# Patient Record
Sex: Female | Born: 1960 | Race: White | Hispanic: No | Marital: Married | Smoking: Never smoker
Health system: Southern US, Community
[De-identification: ages and names within clinical notes are randomized; demographics above are authoritative.]

## PROBLEM LIST (undated history)

## (undated) DIAGNOSIS — C9191 Lymphoid leukemia, unspecified, in remission: Secondary | ICD-10-CM

## (undated) DIAGNOSIS — G451 Carotid artery syndrome (hemispheric): Secondary | ICD-10-CM

## (undated) DIAGNOSIS — K219 Gastro-esophageal reflux disease without esophagitis: Secondary | ICD-10-CM

## (undated) HISTORY — PX: VENTRICULOPERITONEAL SHUNT: SHX204

---

## 2018-02-11 ENCOUNTER — Encounter: Payer: Self-pay | Admitting: Neurology

## 2018-04-01 ENCOUNTER — Ambulatory Visit (INDEPENDENT_AMBULATORY_CARE_PROVIDER_SITE_OTHER): Admitting: Neurology

## 2018-04-01 ENCOUNTER — Encounter: Payer: Self-pay | Admitting: Neurology

## 2018-04-01 ENCOUNTER — Telehealth: Payer: Self-pay

## 2018-04-01 VITALS — BP 106/68 | HR 98 | Ht 66.0 in | Wt 208.0 lb

## 2018-04-01 DIAGNOSIS — G44229 Chronic tension-type headache, not intractable: Secondary | ICD-10-CM | POA: Diagnosis not present

## 2018-04-01 DIAGNOSIS — R29898 Other symptoms and signs involving the musculoskeletal system: Secondary | ICD-10-CM | POA: Diagnosis not present

## 2018-04-01 NOTE — Telephone Encounter (Signed)
[facility] [phone] to get fax # to request most recent EKG [phone]

## 2018-04-01 NOTE — Progress Notes (Signed)
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  History supplemented by referring provider's note.  She started having headaches a year ago.  They are moderate to severe nonthrobbing headache located frontal or temporal either side or right occipital region.  There is no preceding Martha.  They last 6 hours to all day and occur daily.  They are not associated with nausea, vomiting, photophobia, phonophobia, autonomic symptoms or unilateral numbness or weakness.  They are not aggravated by anything specific.  They are relieved when applying pressure to her head or eye.  She takes Tylenol 4 to 5 days a week.  She was told by her oncologist not to take NSAIDs.  She reports prior history of migraines in her 19s which aborted after having her son at age 57.    Caffeine:  2 cups coffee daily Diet:  Hydrates Sleep hygiene:  Good Depression:  No; Anxiety: No  Other History:  She was diagnosed and treated for ALL in 2010. She underwent transplant and has since been in remission.  Her left eye is affected.  She was admitted to [facility] in [place], [place] on 05/19/15 for possible TIA.  She had sudden onset word-finding difficulty with difficulty naming objects and left upper extremity ataxia.  There was no associated unilateral numbness or weakness or headache.  Symptoms lasted 5 minutes.  She was transferred from another ED.  CT of head showed prior right frontal shunt (for prior ALL treatment) but was negative for acute findings.  tPA was not administered due to resolution of symptoms.  MRI of brain demonstrated micro-hemorrhages but was  negative for acute infarct.  MRA of head and neck revealed no significant large vessel stenosis or occlusion.  She was not on ASA at the time.  She was discharged on ASA 325mg , atorvastatin 40mg  daily, lisinopril and Toprol XL.  She had a similar event in October 2015 in [place] where she experienced speech impairment and right hand tingling lasting 10 minutes.  She was admitted to [place].  MRI of brain reportedly negative for acute stroke.  TTE showed decreased EF of 35-40% and she was started on metoprolol and lisinopril.  She was discharged on ASA and statin.  Repeat TTE from December 2015 showed EF 53% and antihypertensives were discontinued.  She is currently taking ASA 81mg  daily and atorvastatin 40mg  daily.  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 pantoprazole (PROTONIX) 20 MG tablet Take by mouth.    . Albuterol Sulfate (PROAIR RESPICLICK) 185 (90 Base) MCG/ACT AEPB Inhale into the lungs.    . ASPIRIN LOW DOSE 81 MG EC tablet Take 81 mg by mouth daily.  3  . atorvastatin (LIPITOR) 40 MG tablet Take by mouth.    . candesartan (ATACAND) 16 MG tablet TK 1/2 T PO QD  3  . cetirizine (ZYRTEC) 10 MG tablet Take 10 mg by mouth as needed.  3  . estradiol (ESTRACE) 0.1 MG/GM vaginal cream   2  . fluticasone (FLONASE) 50 MCG/ACT nasal spray Place into the nose.    Marland Kitchen MAGNESIUM-OXIDE 400 (241.3 Mg) MG tablet TK 1 T PO QD  2  . metoprolol succinate (TOPROL-XL) 25 MG 24 hr tablet TK 1 T PO QD  3  . montelukast (SINGULAIR) 10 MG tablet     . MYRBETRIQ 50 MG TB24 tablet     . SYSTANE 0.4-0.3 % SOLN      No current facility-administered medications on file prior to visit.     ALLERGIES: Not on File  FAMILY HISTORY: Family  History  Problem Relation Age of Onset  . Breast cancer Mother   . Lymphoma Father     SOCIAL HISTORY: Social History   Socioeconomic History  . Marital status: Married    Spouse name: Merry Proud  . Number of children: Not on file  . Years of education: Not on file  . Highest education level: Some college, no degree  Occupational History  . Occupation: homemaker  Social Needs  . Financial resource strain: Not on file  . Food insecurity:    Worry: Not on file    Inability: Not on file  . Transportation needs:    Medical: Not on file    Non-medical: Not on file  Tobacco Use  . Smoking status: Never Smoker  . Smokeless tobacco: Never Used  Substance and Sexual Activity  . Alcohol use: Not Currently  . Drug use: Never  . Sexual activity: Not on file  Lifestyle  . Physical activity:    Days per week: Not on file    Minutes per session: Not on file  . Stress: Not on file  Relationships  . Social connections:    Talks on phone: Not on file    Gets together: Not on file    Attends religious service: Not on file    Active member of club or organization: Not on file    Attends meetings of clubs or organizations: Not on file    Relationship status: Not on file  . Intimate partner violence:    Fear of current or ex partner: Not on file    Emotionally abused: Not on file    Physically abused: Not on file    Forced sexual activity: Not on file  Other Topics Concern  . Not on file  Social History Narrative   Patient is right-handed. She lives with her husband in a 2 story house. She drinks 2 cups of coffee a day. She participates in water aerobics 5 x week.    REVIEW OF SYSTEMS: Constitutional: No fevers, chills, or sweats, no generalized fatigue, change in appetite Eyes: No visual changes, double vision, eye pain Ear, nose and throat: No hearing loss, ear pain, nasal congestion, sore throat Cardiovascular: No chest pain, palpitations Respiratory:  No shortness of breath at  rest or with exertion, wheezes GastrointestinaI: No nausea, vomiting, diarrhea, abdominal pain, fecal incontinence Genitourinary:  No dysuria, urinary retention or frequency Musculoskeletal:  No neck pain, back pain Integumentary: No rash, pruritus, skin lesions Neurological: as above Psychiatric: No depression, insomnia, anxiety Endocrine: No palpitations, fatigue, diaphoresis, mood swings, change in appetite, change in weight, increased thirst Hematologic/Lymphatic:  No purpura, petechiae. Allergic/Immunologic: no itchy/runny eyes, nasal congestion, recent allergic reactions, rashes  PHYSICAL EXAM: Vitals:   04/01/18 0921  BP: 106/68  Pulse: 98  SpO2: 95%   General: No acute distress.  Patient appears well-groomed.  Head:  Normocephalic/atraumatic Eyes:  fundi examined but not visualized Neck: supple, no paraspinal tenderness, full range of motion Back: No paraspinal tenderness Heart: regular rate and rhythm Lungs: Clear to auscultation bilaterally. Vascular: No carotid bruits. Neurological Exam: Mental status: alert and oriented to person, place, and time, recent and remote memory intact, fund of knowledge intact, attention and concentration intact, speech fluent and not dysarthric, language intact. Cranial nerves: CN I: not tested CN II: pupils equal, round and reactive to light, visual fields intact CN III, IV, VI:  full range of motion, no nystagmus, no ptosis CN V: facial sensation intact CN VII: upper and lower face symmetric CN VIII: hearing intact CN IX, X: gag intact, uvula midline CN XI: sternocleidomastoid and trapezius muscles intact CN XII: tongue midline Bulk & Tone: normal, no fasciculations. Motor:  4+/5 left deltoid.  Otherwise, 5/5 throughout Sensation:  Pinprick and vibration sensation intact. Deep Tendon Reflexes:  2+ throughout, toes downgoing.  Finger to nose testing:  Without dysmetria.  Heel to shin:  Without dysmetria.  Gait:  Normal station and  stride.  Able to turn and tandem walk. Romberg negative.  IMPRESSION: Chronic tension-type headache Left deltoid weakness.  She said she had a dTAP shot 2 days ago, however she denies associated pain.  PLAN: 1.  Plan is to start nortriptyline 10mg  at bedtime.  Will first check QT interval on recent EKG from PCP (patient also on Myrbetriq) 2.  Limit Tylenol to no more than 2 days out of week to prevent rebound headache.  May take with caffeine.  Otherwise, eliminate caffeine 3.  Headache diary 4.  I have no explanation for left deltoid weakness.  We will check MRI of brain.  Thank you for allowing me to take part in the care of this patient.  Metta Clines, DO

## 2018-04-01 NOTE — Patient Instructions (Addendum)
1.  I will review EKG first.  If okay, then start nortriptyline 10mg  at bedtime.  If headaches not improved in 4 weeks, contact me and we can increase dose 2.  Limit use of Tylenol to no more than 2 days out of week to prevent rebound headache 3.  Keep headache diary 4.  Stop coffee 5.  Due to right shoulder weakness, will check MRI of brain 6.  Follow up in 3 to 4 months.  We have sent a referral to [facility] for your MRI and they will call you directly to schedule your appt. They are located at [place]. If you need to contact them directly please call [phone].

## 2018-04-03 ENCOUNTER — Telehealth: Payer: Self-pay | Admitting: Neurology

## 2018-04-03 NOTE — Telephone Encounter (Signed)
Called and spoke with Pt, advised order has been faxed. Confirmed fax #

## 2018-04-03 NOTE — Telephone Encounter (Signed)
Patient called regarding having her Order sent to [facility] versus [facility]. She said that [facility] will not take her Insurance.  Thanks

## 2018-04-06 ENCOUNTER — Telehealth: Payer: Self-pay | Admitting: Neurology

## 2018-04-06 NOTE — Telephone Encounter (Signed)
Melissa called from [facility]. She is needing a new order faxed over to them with more information and History. She said at this time she has not scheduled the patient. Thanks

## 2018-04-07 NOTE — Telephone Encounter (Signed)
Faxed new order and consult note

## 2018-04-08 ENCOUNTER — Telehealth: Payer: Self-pay | Admitting: Neurology

## 2018-04-08 NOTE — Telephone Encounter (Signed)
Melissa called back this morning checking on the status of the order. She said it looks like it has not been scheduled. Can you re fax the order to [phone]. Thanks

## 2018-04-13 NOTE — Telephone Encounter (Signed)
Called and LMOVM for Melissa to return my call. Order and notes were faxed

## 2018-04-17 ENCOUNTER — Ambulatory Visit: Payer: Self-pay | Admitting: Neurology

## 2018-04-17 ENCOUNTER — Encounter

## 2018-04-22 ENCOUNTER — Telehealth: Payer: Self-pay | Admitting: Neurology

## 2018-04-22 NOTE — Telephone Encounter (Signed)
Called and spoke with Pt, advised her of MRI results. 

## 2018-04-22 NOTE — Telephone Encounter (Signed)
I reviewed Martha Green's MRI of brain without contrast report from 04/17/18.  It reveals chronic changes from her VP shunt but no stroke or tumor.

## 2018-07-01 NOTE — Progress Notes (Signed)
NEUROLOGY FOLLOW UP OFFICE NOTE  Len Kluver [id number]  HISTORY OF PRESENT ILLNESS: Martha Green is a 57 year old right-handed female with cardiomyopathy, hyperlipidemia, osteopenia, GERD and history of a LL status post transplant in remission and TIAs who follows up for chronic tension type headache.  UPDATE: Intensity:  moderate Duration:  2 hours Frequency:  One day a week Frequency of abortive medication: once a week Current NSAIDS:  ASA 81mg  Current analgesics:  Tylenol as needed Current triptans:  none Current ergotamine: None Current anti-emetic: None Current muscle relaxants: None Current anti-anxiolytic: None Current sleep aide: None Current Antihypertensive medications:  Toprol XL Current Antidepressant medications: None Current Anticonvulsant medications: None Current anti-CGRP: None Current Vitamins/Herbal/Supplements:  magnesium Current Antihistamines/Decongestants: None Other therapy: None  Last visit in August, she exhibited left deltoid weakness on exam.  She reported recent dTAP in that deltoid but denied any associated pain.  MRI of brain with and without contrast from 04/18/18 demonstrated stable VP shunt with chronic nonspecific white matter changes but no evidence of hydrocephalus or acute intracranial abnormality  Caffeine: Down to 1 cup of coffee daily Hydration: Yes Sleep hygiene: Good Depression: No; anxiety: No  HISTORY:  She started having headaches in 2018.  They are moderate to severe nonthrobbing headache located frontal or temporal either side or right occipital region.  There is no preceding aura.  They last 6 hours to all day and occur daily.  They are not associated with nausea, vomiting, photophobia, phonophobia, autonomic symptoms or unilateral numbness or weakness.  They are not aggravated by anything specific.  They are relieved when applying pressure to her head or eye.  She takes Tylenol 4 to 5 days a week.  She was told by her oncologist  not to take NSAIDs.  She reports prior history of migraines in her 3s which aborted after having her son at age 35.    Other History:  She was diagnosed and treated for ALL in 2010. She underwent transplant and has since been in remission.  Her left eye is affected.  She was admitted to [facility] in [place], [place] on 05/19/15 for possible TIA.  She had sudden onset word-finding difficulty with difficulty naming objects and left upper extremity ataxia.  There was no associated unilateral numbness or weakness or headache.  Symptoms lasted 5 minutes.  She was transferred from another ED.  CT of head showed prior right frontal shunt (for prior ALL treatment) but was negative for acute findings.  tPA was not administered due to resolution of symptoms.  MRI of brain demonstrated micro-hemorrhages but was negative for acute infarct.  MRA of head and neck revealed no significant large vessel stenosis or occlusion.  She was not on ASA at the time.  She was discharged on ASA 325mg , atorvastatin 40mg  daily, lisinopril and Toprol XL.  She had a similar event in October 2015 in [place] where she experienced speech impairment and right hand tingling lasting 10 minutes.  She was admitted to [place].  MRI of brain reportedly negative for acute stroke.  TTE showed decreased EF of 35-40% and she was started on metoprolol and lisinopril.  She was discharged on ASA and statin.  Repeat TTE from December 2015 showed EF 53% and antihypertensives were discontinued.  She   PAST MEDICAL HISTORY: ALL in remission Transient ischemic attack x2 Cardiomyopathy Hyperlipidemia Osteopenia GERD  MEDICATIONS: Current Outpatient Medications on File Prior to Visit  Medication Sig Dispense Refill  . Albuterol Sulfate (PROAIR RESPICLICK)  108 (90 Base) MCG/ACT AEPB Inhale into the lungs.    . ASPIRIN LOW DOSE 81 MG EC tablet Take 81 mg by mouth daily.  3  . atorvastatin (LIPITOR) 40 MG tablet Take by mouth.    .  candesartan (ATACAND) 16 MG tablet TK 1/2 T PO QD  3  . cetirizine (ZYRTEC) 10 MG tablet Take 10 mg by mouth as needed.  3  . estradiol (ESTRACE) 0.1 MG/GM vaginal cream   2  . fluticasone (FLONASE) 50 MCG/ACT nasal spray Place into the nose.    Marland Kitchen MAGNESIUM-OXIDE 400 (241.3 Mg) MG tablet TK 1 T PO QD  2  . metoprolol succinate (TOPROL-XL) 25 MG 24 hr tablet TK 1 T PO QD  3  . montelukast (SINGULAIR) 10 MG tablet     . MYRBETRIQ 50 MG TB24 tablet     . pantoprazole (PROTONIX) 20 MG tablet Take by mouth.    . SYSTANE 0.4-0.3 % SOLN      No current facility-administered medications on file prior to visit.     ALLERGIES: Not on File  FAMILY HISTORY: Family History  Problem Relation Age of Onset  . Breast cancer Mother   . Lymphoma Father    SOCIAL HISTORY: Social History   Socioeconomic History  . Marital status: Married    Spouse name: Merry Proud  . Number of children: Not on file  . Years of education: Not on file  . Highest education level: Some college, no degree  Occupational History  . Occupation: homemaker  Social Needs  . Financial resource strain: Not on file  . Food insecurity:    Worry: Not on file    Inability: Not on file  . Transportation needs:    Medical: Not on file    Non-medical: Not on file  Tobacco Use  . Smoking status: Never Smoker  . Smokeless tobacco: Never Used  Substance and Sexual Activity  . Alcohol use: Not Currently  . Drug use: Never  . Sexual activity: Not on file  Lifestyle  . Physical activity:    Days per week: Not on file    Minutes per session: Not on file  . Stress: Not on file  Relationships  . Social connections:    Talks on phone: Not on file    Gets together: Not on file    Attends religious service: Not on file    Active member of club or organization: Not on file    Attends meetings of clubs or organizations: Not on file    Relationship status: Not on file  . Intimate partner violence:    Fear of current or ex partner:  Not on file    Emotionally abused: Not on file    Physically abused: Not on file    Forced sexual activity: Not on file  Other Topics Concern  . Not on file  Social History Narrative   Patient is right-handed. She lives with her husband in a 2 story house. She drinks 2 cups of coffee a day. She participates in water aerobics 5 x week.    REVIEW OF SYSTEMS: Constitutional: No fevers, chills, or sweats, no generalized fatigue, change in appetite Eyes: No visual changes, double vision, eye pain Ear, nose and throat: No hearing loss, ear pain, nasal congestion, sore throat Cardiovascular: No chest pain, palpitations Respiratory:  No shortness of breath at rest or with exertion, wheezes GastrointestinaI: No nausea, vomiting, diarrhea, abdominal pain, fecal incontinence Genitourinary:  No dysuria, urinary retention  or frequency Musculoskeletal:  No neck pain, back pain Integumentary: No rash, pruritus, skin lesions Neurological: as above Psychiatric: No depression, insomnia, anxiety Endocrine: No palpitations, fatigue, diaphoresis, mood swings, change in appetite, change in weight, increased thirst Hematologic/Lymphatic:  No purpura, petechiae. Allergic/Immunologic: no itchy/runny eyes, nasal congestion, recent allergic reactions, rashes  PHYSICAL EXAM: Blood pressure 98/68, pulse 86, height 5\' 6"  (1.676 m), weight 191 lb (86.6 kg), SpO2 97 %. General: No acute distress.  Patient appears well-groomed.   Head:  Normocephalic/atraumatic Eyes:  Fundi examined but not visualized Neck: supple, no paraspinal tenderness, full range of motion Heart:  Regular rate and rhythm Lungs:  Clear to auscultation bilaterally Back: No paraspinal tenderness Neurological Exam: alert and oriented to person, place, and time. Attention span and concentration intact, recent and remote memory intact, fund of knowledge intact.  Speech fluent and not dysarthric, language intact.  CN II-XII intact. Bulk and tone  normal, muscle strength 5/5 throughout.  Sensation to light touch intact.  Deep tendon reflexes 2+ throughout, toes downgoing.  Finger to nose and heel to shin testing intact.  Gait normal, Romberg negative.  IMPRESSION: 1.  Episodic tension-type headache, not intractable 2.  Right deltoid weakness.  Now resolved.  Likely, it was just residual from recent dTAP  PLAN: 1.  Tylenol as needed, limited to no more than 2 days out of week to prevent rebound headache 2.  Keep headache diary 3.  Follow up in 6 months.  Metta Clines, DO

## 2018-07-02 ENCOUNTER — Encounter: Payer: Self-pay | Admitting: Neurology

## 2018-07-02 ENCOUNTER — Ambulatory Visit (INDEPENDENT_AMBULATORY_CARE_PROVIDER_SITE_OTHER): Payer: TRICARE For Life (TFL) | Admitting: Neurology

## 2018-07-02 VITALS — BP 98/68 | HR 86 | Ht 66.0 in | Wt 191.0 lb

## 2018-07-02 DIAGNOSIS — G44219 Episodic tension-type headache, not intractable: Secondary | ICD-10-CM | POA: Diagnosis not present

## 2018-07-02 NOTE — Patient Instructions (Signed)
1.  Use Tylenol as needed but limit to no more than 2 days out of week to prevent rebound headache 2.  Follow up in 6 months

## 2018-09-25 ENCOUNTER — Emergency Department (HOSPITAL_COMMUNITY)

## 2018-09-25 ENCOUNTER — Inpatient Hospital Stay (HOSPITAL_COMMUNITY)
Admission: EM | Admit: 2018-09-25 | Discharge: 2018-10-25 | DRG: 082 | Disposition: E | Attending: Neurosurgery | Admitting: Neurosurgery

## 2018-09-25 ENCOUNTER — Encounter (HOSPITAL_COMMUNITY): Payer: Self-pay

## 2018-09-25 ENCOUNTER — Emergency Department (HOSPITAL_COMMUNITY): Admission: EM | Admit: 2018-09-25 | Discharge: 2018-09-25 | Discharge status: Against Medical Advice | Payer: Self-pay

## 2018-09-25 ENCOUNTER — Other Ambulatory Visit: Payer: Self-pay

## 2018-09-25 DIAGNOSIS — S0001XA Abrasion of scalp, initial encounter: Secondary | ICD-10-CM | POA: Diagnosis not present

## 2018-09-25 DIAGNOSIS — Z7982 Long term (current) use of aspirin: Secondary | ICD-10-CM | POA: Diagnosis not present

## 2018-09-25 DIAGNOSIS — H5702 Anisocoria: Secondary | ICD-10-CM | POA: Diagnosis present

## 2018-09-25 DIAGNOSIS — G935 Compression of brain: Secondary | ICD-10-CM | POA: Diagnosis not present

## 2018-09-25 DIAGNOSIS — Y92009 Unspecified place in unspecified non-institutional (private) residence as the place of occurrence of the external cause: Secondary | ICD-10-CM

## 2018-09-25 DIAGNOSIS — S066X9A Traumatic subarachnoid hemorrhage with loss of consciousness of unspecified duration, initial encounter: Secondary | ICD-10-CM | POA: Diagnosis present

## 2018-09-25 DIAGNOSIS — J9690 Respiratory failure, unspecified, unspecified whether with hypoxia or hypercapnia: Secondary | ICD-10-CM

## 2018-09-25 DIAGNOSIS — E669 Obesity, unspecified: Secondary | ICD-10-CM | POA: Diagnosis not present

## 2018-09-25 DIAGNOSIS — Z982 Presence of cerebrospinal fluid drainage device: Secondary | ICD-10-CM | POA: Diagnosis not present

## 2018-09-25 DIAGNOSIS — R402312 Coma scale, best motor response, none, at arrival to emergency department: Secondary | ICD-10-CM | POA: Diagnosis present

## 2018-09-25 DIAGNOSIS — R092 Respiratory arrest: Secondary | ICD-10-CM | POA: Diagnosis present

## 2018-09-25 DIAGNOSIS — Z807 Family history of other malignant neoplasms of lymphoid, hematopoietic and related tissues: Secondary | ICD-10-CM

## 2018-09-25 DIAGNOSIS — G936 Cerebral edema: Secondary | ICD-10-CM | POA: Diagnosis not present

## 2018-09-25 DIAGNOSIS — Z4659 Encounter for fitting and adjustment of other gastrointestinal appliance and device: Secondary | ICD-10-CM

## 2018-09-25 DIAGNOSIS — S065X9A Traumatic subdural hemorrhage with loss of consciousness of unspecified duration, initial encounter: Secondary | ICD-10-CM

## 2018-09-25 DIAGNOSIS — C9191 Lymphoid leukemia, unspecified, in remission: Secondary | ICD-10-CM | POA: Diagnosis present

## 2018-09-25 DIAGNOSIS — Z803 Family history of malignant neoplasm of breast: Secondary | ICD-10-CM

## 2018-09-25 DIAGNOSIS — R402212 Coma scale, best verbal response, none, at arrival to emergency department: Secondary | ICD-10-CM | POA: Diagnosis not present

## 2018-09-25 DIAGNOSIS — I609 Nontraumatic subarachnoid hemorrhage, unspecified: Secondary | ICD-10-CM

## 2018-09-25 DIAGNOSIS — W19XXXA Unspecified fall, initial encounter: Secondary | ICD-10-CM

## 2018-09-25 DIAGNOSIS — R402112 Coma scale, eyes open, never, at arrival to emergency department: Secondary | ICD-10-CM | POA: Diagnosis not present

## 2018-09-25 DIAGNOSIS — Z6828 Body mass index (BMI) 28.0-28.9, adult: Secondary | ICD-10-CM | POA: Diagnosis not present

## 2018-09-25 DIAGNOSIS — K219 Gastro-esophageal reflux disease without esophagitis: Secondary | ICD-10-CM | POA: Diagnosis not present

## 2018-09-25 DIAGNOSIS — S065XAA Traumatic subdural hemorrhage with loss of consciousness status unknown, initial encounter: Secondary | ICD-10-CM

## 2018-09-25 DIAGNOSIS — Z79899 Other long term (current) drug therapy: Secondary | ICD-10-CM

## 2018-09-25 DIAGNOSIS — W109XXA Fall (on) (from) unspecified stairs and steps, initial encounter: Secondary | ICD-10-CM | POA: Diagnosis present

## 2018-09-25 HISTORY — DX: Lymphoid leukemia, unspecified, in remission: C91.91

## 2018-09-25 HISTORY — DX: Carotid artery syndrome (hemispheric): G45.1

## 2018-09-25 HISTORY — DX: Gastro-esophageal reflux disease without esophagitis: K21.9

## 2018-09-25 LAB — CBC
HEMATOCRIT: 31.8 % — AB (ref 36.0–46.0)
Hemoglobin: 10.7 g/dL — ABNORMAL LOW (ref 12.0–15.0)
MCH: 30.5 pg (ref 26.0–34.0)
MCHC: 33.6 g/dL (ref 30.0–36.0)
MCV: 90.6 fL (ref 80.0–100.0)
Platelets: 301 10*3/uL (ref 150–400)
RBC: 3.51 MIL/uL — ABNORMAL LOW (ref 3.87–5.11)
RDW: 12.7 % (ref 11.5–15.5)
WBC: 14.7 10*3/uL — ABNORMAL HIGH (ref 4.0–10.5)
nRBC: 0 % (ref 0.0–0.2)

## 2018-09-25 LAB — CBG MONITORING, ED: Glucose-Capillary: 124 mg/dL — ABNORMAL HIGH (ref 70–99)

## 2018-09-25 LAB — PROTIME-INR
INR: 1.03
Prothrombin Time: 13.4 seconds (ref 11.4–15.2)

## 2018-09-25 LAB — SAMPLE TO BLOOD BANK

## 2018-09-25 MED ORDER — ETOMIDATE 2 MG/ML IV SOLN
INTRAVENOUS | Status: AC | PRN
  Administered 2018-09-25: 20 mg via INTRAVENOUS

## 2018-09-25 MED ORDER — ROCURONIUM BROMIDE 50 MG/5ML IV SOLN
INTRAVENOUS | Status: AC | PRN
  Administered 2018-09-25: 80 mg via INTRAVENOUS

## 2018-09-25 MED ORDER — PROPOFOL 1000 MG/100ML IV EMUL
INTRAVENOUS | Status: AC
  Administered 2018-09-25: 5 ug/kg/min via INTRAVENOUS
  Filled 2018-09-25: qty 100

## 2018-09-25 MED ORDER — PROPOFOL 1000 MG/100ML IV EMUL
5.0000 ug/kg/min | INTRAVENOUS | Status: DC
  Administered 2018-09-25: 5 ug/kg/min via INTRAVENOUS

## 2018-09-25 NOTE — ED Notes (Signed)
Successful intubation w/ 7.5 ETT by MD Palumbo. Equal blt breath sounds w/ good color changes. Sats 99%

## 2018-09-25 NOTE — ED Triage Notes (Signed)
Pt BIB [facility] s/p fall. Pt fell backwards down 3 steps striking head and was found down. Initially GCS of 11 for EMS answering yes/no, decompensated to GCS of 3 on arrival. Pt is unresponsive, maintaining airway.

## 2018-09-25 NOTE — ED Notes (Signed)
Pt to CT w/ RN, trauma MD and ED MD

## 2018-09-25 NOTE — Progress Notes (Signed)
   09/22/2018 2350  Clinical Encounter Type  Visited With Family;Patient not available;Health care provider  Visit Type Initial;ED;Trauma  Referral From Other (Comment) (level 1 pg)  Spiritual Encounters  Spiritual Needs Emotional  Stress Factors  Family Stress Factors Other (Comment) (friends present, family out of state)  Regulatory affairs officer (For Healthcare)  Does Patient Have a Medical Advance Directive? No  Would patient like information on creating a medical advance directive? No - Patient declined  [place]  Does Patient Have a Mental Health Advance Directive? No  Would patient like information on creating a mental health advance directive? No - Patient declined   Responded to level 1 trauma, spoke w/ neighbors who had just dropped pt off at her house, saw her fall, and called 911.  Got husband and son's cell #s from neighbors, gave to RN and to trauma dr.  Bo Mcclintock friends/neighbors know to convey to son to start driving this direction from his home in [place].    Chaplain remains available.  Myra Gianotti resident, [phone]

## 2018-09-26 ENCOUNTER — Encounter (HOSPITAL_COMMUNITY): Payer: Self-pay | Admitting: Emergency Medicine

## 2018-09-26 ENCOUNTER — Inpatient Hospital Stay (HOSPITAL_COMMUNITY)

## 2018-09-26 DIAGNOSIS — Z7982 Long term (current) use of aspirin: Secondary | ICD-10-CM | POA: Diagnosis not present

## 2018-09-26 DIAGNOSIS — K219 Gastro-esophageal reflux disease without esophagitis: Secondary | ICD-10-CM | POA: Diagnosis present

## 2018-09-26 DIAGNOSIS — G935 Compression of brain: Secondary | ICD-10-CM | POA: Diagnosis present

## 2018-09-26 DIAGNOSIS — E669 Obesity, unspecified: Secondary | ICD-10-CM | POA: Diagnosis present

## 2018-09-26 DIAGNOSIS — R402312 Coma scale, best motor response, none, at arrival to emergency department: Secondary | ICD-10-CM | POA: Diagnosis present

## 2018-09-26 DIAGNOSIS — G936 Cerebral edema: Secondary | ICD-10-CM | POA: Diagnosis present

## 2018-09-26 DIAGNOSIS — S0001XA Abrasion of scalp, initial encounter: Secondary | ICD-10-CM | POA: Diagnosis present

## 2018-09-26 DIAGNOSIS — H5702 Anisocoria: Secondary | ICD-10-CM | POA: Diagnosis present

## 2018-09-26 DIAGNOSIS — Z982 Presence of cerebrospinal fluid drainage device: Secondary | ICD-10-CM | POA: Diagnosis not present

## 2018-09-26 DIAGNOSIS — R402212 Coma scale, best verbal response, none, at arrival to emergency department: Secondary | ICD-10-CM | POA: Diagnosis present

## 2018-09-26 DIAGNOSIS — W109XXA Fall (on) (from) unspecified stairs and steps, initial encounter: Secondary | ICD-10-CM | POA: Diagnosis present

## 2018-09-26 DIAGNOSIS — Z807 Family history of other malignant neoplasms of lymphoid, hematopoietic and related tissues: Secondary | ICD-10-CM | POA: Diagnosis not present

## 2018-09-26 DIAGNOSIS — Z803 Family history of malignant neoplasm of breast: Secondary | ICD-10-CM | POA: Diagnosis not present

## 2018-09-26 DIAGNOSIS — S066X9A Traumatic subarachnoid hemorrhage with loss of consciousness of unspecified duration, initial encounter: Secondary | ICD-10-CM | POA: Diagnosis present

## 2018-09-26 DIAGNOSIS — Z79899 Other long term (current) drug therapy: Secondary | ICD-10-CM | POA: Diagnosis not present

## 2018-09-26 DIAGNOSIS — Z6828 Body mass index (BMI) 28.0-28.9, adult: Secondary | ICD-10-CM | POA: Diagnosis not present

## 2018-09-26 DIAGNOSIS — R402112 Coma scale, eyes open, never, at arrival to emergency department: Secondary | ICD-10-CM | POA: Diagnosis present

## 2018-09-26 DIAGNOSIS — I609 Nontraumatic subarachnoid hemorrhage, unspecified: Secondary | ICD-10-CM

## 2018-09-26 DIAGNOSIS — C9191 Lymphoid leukemia, unspecified, in remission: Secondary | ICD-10-CM | POA: Diagnosis present

## 2018-09-26 DIAGNOSIS — Y92009 Unspecified place in unspecified non-institutional (private) residence as the place of occurrence of the external cause: Secondary | ICD-10-CM | POA: Diagnosis not present

## 2018-09-26 DIAGNOSIS — R092 Respiratory arrest: Secondary | ICD-10-CM | POA: Diagnosis present

## 2018-09-26 LAB — PREPARE FRESH FROZEN PLASMA: Unit division: 0

## 2018-09-26 LAB — POCT I-STAT 7, (LYTES, BLD GAS, ICA,H+H)
Acid-base deficit: 4 mmol/L — ABNORMAL HIGH (ref 0.0–2.0)
Acid-base deficit: 4 mmol/L — ABNORMAL HIGH (ref 0.0–2.0)
Bicarbonate: 19.2 mmol/L — ABNORMAL LOW (ref 20.0–28.0)
Bicarbonate: 21.1 mmol/L (ref 20.0–28.0)
Calcium, Ion: 1.11 mmol/L — ABNORMAL LOW (ref 1.15–1.40)
Calcium, Ion: 1.19 mmol/L (ref 1.15–1.40)
HCT: 32 % — ABNORMAL LOW (ref 36.0–46.0)
HCT: 35 % — ABNORMAL LOW (ref 36.0–46.0)
Hemoglobin: 10.9 g/dL — ABNORMAL LOW (ref 12.0–15.0)
Hemoglobin: 11.9 g/dL — ABNORMAL LOW (ref 12.0–15.0)
O2 Saturation: 100 %
O2 Saturation: 99 %
PCO2 ART: 28.3 mmHg — AB (ref 32.0–48.0)
PH ART: 7.434 (ref 7.350–7.450)
Patient temperature: 96.5
Patient temperature: 97.6
Potassium: 2.6 mmol/L — CL (ref 3.5–5.1)
Potassium: 3.1 mmol/L — ABNORMAL LOW (ref 3.5–5.1)
Sodium: 121 mmol/L — ABNORMAL LOW (ref 135–145)
Sodium: 124 mmol/L — ABNORMAL LOW (ref 135–145)
TCO2: 20 mmol/L — ABNORMAL LOW (ref 22–32)
TCO2: 22 mmol/L (ref 22–32)
pCO2 arterial: 36.8 mmHg (ref 32.0–48.0)
pH, Arterial: 7.363 (ref 7.350–7.450)
pO2, Arterial: 169 mmHg — ABNORMAL HIGH (ref 83.0–108.0)
pO2, Arterial: 259 mmHg — ABNORMAL HIGH (ref 83.0–108.0)

## 2018-09-26 LAB — BASIC METABOLIC PANEL
Anion gap: 13 (ref 5–15)
BUN: 9 mg/dL (ref 6–20)
CO2: 21 mmol/L — ABNORMAL LOW (ref 22–32)
Calcium: 9.1 mg/dL (ref 8.9–10.3)
Chloride: 94 mmol/L — ABNORMAL LOW (ref 98–111)
Creatinine, Ser: 0.82 mg/dL (ref 0.44–1.00)
GFR calc non Af Amer: >60 mL/min (ref >60)
Glucose, Bld: 117 mg/dL — ABNORMAL HIGH (ref 70–99)
Potassium: 3.5 mmol/L (ref 3.5–5.1)
SODIUM: 128 mmol/L — AB (ref 135–145)

## 2018-09-26 LAB — CBC
HCT: 31.5 % — ABNORMAL LOW (ref 36.0–46.0)
Hemoglobin: 10.7 g/dL — ABNORMAL LOW (ref 12.0–15.0)
MCH: 29.7 pg (ref 26.0–34.0)
MCHC: 34 g/dL (ref 30.0–36.0)
MCV: 87.5 fL (ref 80.0–100.0)
NRBC: 0 % (ref 0.0–0.2)
Platelets: 291 10*3/uL (ref 150–400)
RBC: 3.6 MIL/uL — ABNORMAL LOW (ref 3.87–5.11)
RDW: 12.5 % (ref 11.5–15.5)
WBC: 13.3 10*3/uL — AB (ref 4.0–10.5)

## 2018-09-26 LAB — BPAM RBC
Blood Product Expiration Date: 202003042359
Blood Product Expiration Date: 202003042359
ISSUE DATE / TIME: 202001312307
ISSUE DATE / TIME: 202001312307
UNIT TYPE AND RH: 5100
Unit Type and Rh: 5100

## 2018-09-26 LAB — BPAM FFP
Blood Product Expiration Date: 202002022359
Blood Product Expiration Date: 202002022359
ISSUE DATE / TIME: 202001312307
ISSUE DATE / TIME: 202001312307
Unit Type and Rh: 6200
Unit Type and Rh: 6200

## 2018-09-26 LAB — COMPREHENSIVE METABOLIC PANEL
ALT: 17 U/L (ref 0–44)
AST: 25 U/L (ref 15–41)
Albumin: 3.8 g/dL (ref 3.5–5.0)
Alkaline Phosphatase: 87 U/L (ref 38–126)
Anion gap: 15 (ref 5–15)
BUN: 9 mg/dL (ref 6–20)
CALCIUM: 8.8 mg/dL — AB (ref 8.9–10.3)
CO2: 19 mmol/L — ABNORMAL LOW (ref 22–32)
Chloride: 88 mmol/L — ABNORMAL LOW (ref 98–111)
Creatinine, Ser: 0.84 mg/dL (ref 0.44–1.00)
GFR calc Af Amer: >60 mL/min (ref >60)
GFR calc non Af Amer: >60 mL/min (ref >60)
Glucose, Bld: 133 mg/dL — ABNORMAL HIGH (ref 70–99)
Potassium: 2.7 mmol/L — CL (ref 3.5–5.1)
Sodium: 122 mmol/L — ABNORMAL LOW (ref 135–145)
Total Bilirubin: 0.7 mg/dL (ref 0.3–1.2)
Total Protein: 6.9 g/dL (ref 6.5–8.1)

## 2018-09-26 LAB — ETHANOL: Alcohol, Ethyl (B): 106 mg/dL — ABNORMAL HIGH (ref <10)

## 2018-09-26 LAB — TRIGLYCERIDES: Triglycerides: 237 mg/dL — ABNORMAL HIGH (ref <150)

## 2018-09-26 LAB — LACTIC ACID, PLASMA: Lactic Acid, Venous: 2.8 mmol/L (ref 0.5–1.9)

## 2018-09-26 LAB — MRSA PCR SCREENING: MRSA by PCR: NEGATIVE

## 2018-09-26 LAB — CDS SEROLOGY

## 2018-09-26 LAB — HIV ANTIBODY (ROUTINE TESTING W REFLEX): HIV SCREEN 4TH GENERATION: NONREACTIVE

## 2018-09-26 MED ORDER — PANTOPRAZOLE SODIUM 40 MG PO TBEC: 40.0000 mg | DELAYED_RELEASE_TABLET | Freq: Every day | ORAL | Status: DC

## 2018-09-26 MED ORDER — HYDRALAZINE HCL 20 MG/ML IJ SOLN: 10.0000 mg | INTRAMUSCULAR | Status: DC | PRN

## 2018-09-26 MED ORDER — SODIUM CHLORIDE 0.9 % IV BOLUS
500.0000 mL | Freq: Once | INTRAVENOUS | Status: AC
  Administered 2018-09-26: 500 mL via INTRAVENOUS

## 2018-09-26 MED ORDER — FENTANYL CITRATE (PF) 100 MCG/2ML IJ SOLN
100.0000 ug | INTRAMUSCULAR | Status: DC | PRN
  Administered 2018-09-27: 100 ug via INTRAVENOUS
  Filled 2018-09-26: qty 2

## 2018-09-26 MED ORDER — ORAL CARE MOUTH RINSE
15.0000 mL | OROMUCOSAL | Status: DC
  Administered 2018-09-26 – 2018-09-28 (×22): 15 mL via OROMUCOSAL

## 2018-09-26 MED ORDER — ONDANSETRON 4 MG PO TBDP: 4.0000 mg | ORAL_TABLET | Freq: Four times a day (QID) | ORAL | Status: DC | PRN

## 2018-09-26 MED ORDER — METOPROLOL TARTRATE 5 MG/5ML IV SOLN
5.0000 mg | INTRAVENOUS | Status: DC | PRN
  Administered 2018-09-27 – 2018-09-28 (×2): 5 mg via INTRAVENOUS
  Filled 2018-09-26 (×3): qty 5

## 2018-09-26 MED ORDER — ACETAMINOPHEN 160 MG/5ML PO SOLN: 650.0000 mg | Freq: Four times a day (QID) | ORAL | Status: DC | PRN

## 2018-09-26 MED ORDER — ONDANSETRON HCL 4 MG/2ML IJ SOLN: 4.0000 mg | Freq: Four times a day (QID) | INTRAMUSCULAR | Status: DC | PRN

## 2018-09-26 MED ORDER — PANTOPRAZOLE SODIUM 40 MG IV SOLR
40.0000 mg | Freq: Every day | INTRAVENOUS | Status: DC
  Administered 2018-09-26 – 2018-09-28 (×3): 40 mg via INTRAVENOUS
  Filled 2018-09-26 (×3): qty 40

## 2018-09-26 MED ORDER — POTASSIUM CHLORIDE IN NACL 20-0.9 MEQ/L-% IV SOLN
INTRAVENOUS | Status: DC
  Administered 2018-09-26: 1 mL via INTRAVENOUS
  Administered 2018-09-26: 05:00:00 via INTRAVENOUS
  Administered 2018-09-27 (×2): 1000 mL via INTRAVENOUS
  Administered 2018-09-28: 10:00:00 via INTRAVENOUS
  Filled 2018-09-26 (×6): qty 1000

## 2018-09-26 MED ORDER — FENTANYL CITRATE (PF) 100 MCG/2ML IJ SOLN
100.0000 ug | INTRAMUSCULAR | Status: DC | PRN
  Administered 2018-09-26: 100 ug via INTRAVENOUS
  Filled 2018-09-26: qty 2

## 2018-09-26 MED ORDER — METOPROLOL TARTRATE 5 MG/5ML IV SOLN
5.0000 mg | Freq: Four times a day (QID) | INTRAVENOUS | Status: DC | PRN
  Administered 2018-09-26: 5 mg via INTRAVENOUS
  Filled 2018-09-26: qty 5

## 2018-09-26 MED ORDER — PROPOFOL 1000 MG/100ML IV EMUL: 0.0000 ug/kg/min | INTRAVENOUS | Status: DC

## 2018-09-26 MED ORDER — METOPROLOL TARTRATE 5 MG/5ML IV SOLN
5.0000 mg | Freq: Once | INTRAVENOUS | Status: AC
  Administered 2018-09-26: 5 mg via INTRAVENOUS
  Filled 2018-09-26: qty 5

## 2018-09-26 MED ORDER — CHLORHEXIDINE GLUCONATE 0.12% ORAL RINSE (MEDLINE KIT)
15.0000 mL | Freq: Two times a day (BID) | OROMUCOSAL | Status: DC
  Administered 2018-09-26 – 2018-09-28 (×6): 15 mL via OROMUCOSAL

## 2018-09-26 NOTE — Progress Notes (Signed)
Nutrition Brief Note  Patient identified as newly ventilated pt. Per chart and neurology's assessment, pt suffered catastrophic neurologic injury that is not believed to be survivable.   No nutrition assessment or interventions warranted at this time. If nutrition issues arise, please consult RD.   Burtis Junes RD, LDN, CNSC Clinical Nutrition Available Tues-Sat via Pager: [phone] 09/26/2018 8:21 AM

## 2018-09-26 NOTE — Consult Note (Signed)
Reason for Consult: Traumatic brain injury Referring Physician: Trauma  Martha Green is an 58 y.o. female.  HPI: 58 year old female with a remote history of acute lymphocytic leukemia.  Patient status post witnessed fall this evening.  Patient's fell backward striking the back of her head from the height of approximately 3 stairs.  Initially the patient was somnolent but arousable.  She declined upon transport to the emergency department.  Upon arrival in the emergency department the patient had a Glasgow Coma Scale of 3 by report.  She was intubated and resuscitated.  She has regained no neurologic function during this time.  Past Medical History:  Diagnosis Date  . GERD (gastroesophageal reflux disease)   . Hemispheric carotid artery syndrome   . 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 VENTRICULOPERITONEAL SHUNT      Family History  Problem Relation Age of Onset  . Breast cancer Mother   . Lymphoma Father     Social History:  reports that she has never smoked. She has never used smokeless tobacco. She reports previous alcohol use. She reports that she does not use drugs.  Allergies: No Known Allergies  Medications: I have reviewed the patient's current medications.  Results for orders placed or performed during the hospital encounter of 08/28/2018 (from the past 48 hour(s))  CBG monitoring, ED     Status: Abnormal   Collection Time: 09/14/2018 11:17 PM  Result Value Ref Range   Glucose-Capillary 124 (H) 70 - 99 mg/dL   Comment 1 Notify RN   Sample to Blood Bank     Status: None   Collection Time: 09/02/2018 11:18 PM  Result Value Ref Range   Blood Bank Specimen SAMPLE AVAILABLE FOR TESTING    Sample Expiration      09/26/2018 Performed at [facility], [place] [address], [place], [place] [address]   CDS serology     Status: None   Collection Time: 08/30/2018 11:26 PM  Result Value Ref Range   CDS serology specimen       SPECIMEN WILL BE HELD FOR 14 DAYS IF TESTING IS REQUIRED    Comment: SPECIMEN WILL BE HELD FOR 14 DAYS IF TESTING IS REQUIRED SPECIMEN WILL BE HELD FOR 14 DAYS IF TESTING IS REQUIRED Performed at [facility], [place] [address], [place], [place] [address]   CBC     Status: Abnormal   Collection Time: 08/31/2018 11:26 PM  Result Value Ref Range   WBC 14.7 (H) 4.0 - 10.5 K/uL   RBC 3.51 (L) 3.87 - 5.11 MIL/uL   Hemoglobin 10.7 (L) 12.0 - 15.0 g/dL   HCT 31.8 (L) 36.0 - 46.0 %   MCV 90.6 80.0 - 100.0 fL   MCH 30.5 26.0 - 34.0 pg   MCHC 33.6 30.0 - 36.0 g/dL   RDW 12.7 11.5 - 15.5 %   Platelets 301 150 - 400 K/uL   nRBC 0.0 0.0 - 0.2 %    Comment: Performed at [facility], [place] [address], [place], [place] [address]  Ethanol     Status: Abnormal   Collection Time: 09/11/2018 11:26 PM  Result Value Ref Range   Alcohol, Ethyl (B) 106 (H) <10 mg/dL    Comment: (NOTE) Lowest detectable limit for serum alcohol is 10 mg/dL. For medical purposes only. Performed at [facility], [place] [address] [employer], [place] [address]   Protime-INR  Status: None   Collection Time: 09/05/2018 11:26 PM  Result Value Ref Range   Prothrombin Time 13.4 11.4 - 15.2 seconds   INR 1.03     Comment: Performed at [facility], [place] [address] [place], [place] [address]  I-STAT 7, (LYTES, BLD GAS, ICA, H+H)     Status: Abnormal   Collection Time: 09/26/18 12:25 AM  Result Value Ref Range   pH, Arterial 7.434 7.350 - 7.450   pCO2 arterial 28.3 (L) 32.0 - 48.0 mmHg   pO2, Arterial 259.0 (H) 83.0 - 108.0 mmHg   Bicarbonate 19.2 (L) 20.0 - 28.0 mmol/L   TCO2 20 (L) 22 - 32 mmol/L   O2 Saturation 100.0 %   Acid-base deficit 4.0 (H) 0.0 - 2.0 mmol/L   Sodium 121 (L) 135 - 145 mmol/L   Potassium 2.6 (LL) 3.5 - 5.1 mmol/L   Calcium, Ion 1.11 (L) 1.15 - 1.40 mmol/L   HCT 32.0 (L) 36.0 - 46.0 %   Hemoglobin 10.9 (L) 12.0 - 15.0 g/dL   Patient temperature 96.5 F    Collection site RADIAL,  ALLEN'S TEST ACCEPTABLE    Drawn by RT    Sample type ARTERIAL    Comment NOTIFIED PHYSICIAN     Ct Head Wo Contrast  Result Date: 09/10/2018 CLINICAL DATA:  Fall backwards down stairs, hit head.  Unresponsive. EXAM: CT HEAD WITHOUT CONTRAST CT CERVICAL SPINE WITHOUT CONTRAST TECHNIQUE: Multidetector CT imaging of the head and cervical spine was performed following the standard protocol without intravenous contrast. Multiplanar CT image reconstructions of the cervical spine were also generated. COMPARISON:  None. FINDINGS: CT HEAD FINDINGS Brain: There is diffuse subarachnoid blood. This is most pronounced over the frontal lobes bilaterally. Blood is also noted along the tentorium with largest area posteriorly measuring up to 1.3 cm in thickness. This could be subarachnoid or subdural in location. No visible intraparenchymal hemorrhage. Right frontal VP shunt is in place. Ventricles are decompressed. No visible herniation. Vascular: Difficult to visualize the vessels due to the diffuse subarachnoid blood. Skull: No acute calvarial abnormality. Sinuses/Orbits: Coastal thickening throughout the paranasal sinuses. Mastoid air cells clear. Other: None CT CERVICAL SPINE FINDINGS Alignment: No subluxation. Skull base and vertebrae: No acute fracture. No primary bone lesion or focal pathologic process. Soft tissues and spinal canal: No prevertebral fluid or swelling. No visible canal hematoma. Disc levels: Degenerative disc disease at C5-6 with disc space narrowing and spurring. Mild diffuse degenerative facet disease bilaterally, left greater than right. Upper chest: No acute findings. Other: None IMPRESSION: Diffuse subarachnoid hemorrhage, most notable over both frontal lobes. Interhemispheric blood along the falx, thickest posteriorly measuring up to 1.3 cm in thickness. No intraparenchymal hemorrhage.  No visible herniation currently. Degenerative disc and facet disease in the cervical spine. No acute bony  abnormality. Critical Value/emergent results were discussed in person at the time of interpretation on 09/13/2018 at 11:54 pm with Dr. Georganna Skeans . Electronically Signed   By: Rolm Baptise M.D.   On: 09/04/2018 23:55   Ct Cervical Spine Wo Contrast  Result Date: 09/22/2018 CLINICAL DATA:  Fall backwards down stairs, hit head.  Unresponsive. EXAM: CT HEAD WITHOUT CONTRAST CT CERVICAL SPINE WITHOUT CONTRAST TECHNIQUE: Multidetector CT imaging of the head and cervical spine was performed following the standard protocol without intravenous contrast. Multiplanar CT image reconstructions of the cervical spine were also generated. COMPARISON:  None. FINDINGS: CT HEAD FINDINGS Brain: There is diffuse subarachnoid blood. This is most pronounced over the  frontal lobes bilaterally. Blood is also noted along the tentorium with largest area posteriorly measuring up to 1.3 cm in thickness. This could be subarachnoid or subdural in location. No visible intraparenchymal hemorrhage. Right frontal VP shunt is in place. Ventricles are decompressed. No visible herniation. Vascular: Difficult to visualize the vessels due to the diffuse subarachnoid blood. Skull: No acute calvarial abnormality. Sinuses/Orbits: Coastal thickening throughout the paranasal sinuses. Mastoid air cells clear. Other: None CT CERVICAL SPINE FINDINGS Alignment: No subluxation. Skull base and vertebrae: No acute fracture. No primary bone lesion or focal pathologic process. Soft tissues and spinal canal: No prevertebral fluid or swelling. No visible canal hematoma. Disc levels: Degenerative disc disease at C5-6 with disc space narrowing and spurring. Mild diffuse degenerative facet disease bilaterally, left greater than right. Upper chest: No acute findings. Other: None IMPRESSION: Diffuse subarachnoid hemorrhage, most notable over both frontal lobes. Interhemispheric blood along the falx, thickest posteriorly measuring up to 1.3 cm in thickness. No  intraparenchymal hemorrhage.  No visible herniation currently. Degenerative disc and facet disease in the cervical spine. No acute bony abnormality. Critical Value/emergent results were discussed in person at the time of interpretation on 08/29/2018 at 11:54 pm with Dr. Georganna Skeans . Electronically Signed   By: Rolm Baptise M.D.   On: 08/26/2018 23:55   Dg Pelvis Portable  Result Date: 09/12/2018 CLINICAL DATA:  Level 1 trauma. Unwitnessed fall tonight. Unresponsive. EXAM: PORTABLE PELVIS 1-2 VIEWS COMPARISON:  None. FINDINGS: There is no evidence of pelvic fracture or diastasis. No pelvic bone lesions are seen. IMPRESSION: Negative. Electronically Signed   By: Lucienne Capers M.D.   On: 09/10/2018 23:34   Dg Chest Port 1 View  Result Date: 08/31/2018 CLINICAL DATA:  Level 1 trauma.  Unwitnessed fall.  Unresponsive. EXAM: PORTABLE CHEST 1 VIEW COMPARISON:  None. FINDINGS: Endotracheal tube with tip measuring 2.8 cm above the carina. Shallow inspiration. The heart size and mediastinal contours are within normal limits. Both lungs are clear. The visualized skeletal structures are unremarkable. IMPRESSION: No active disease. Electronically Signed   By: Lucienne Capers M.D.   On: 09/12/2018 23:35   Dg Abd Portable 1v  Result Date: 09/26/2018 CLINICAL DATA:  OG tube placement EXAM: PORTABLE ABDOMEN - 1 VIEW COMPARISON:  None. FINDINGS: Enteric tube tip is in the upper mid abdomen consistent with location in the body of the stomach. Bowel gas pattern is normal with scattered gas in the colon. No small or large bowel distention. Visualized bones appear intact. No radiopaque stones. IMPRESSION: Enteric tube tip is in the upper mid abdomen consistent with location in the body of the stomach. Nonobstructive bowel gas pattern. Electronically Signed   By: Lucienne Capers M.D.   On: 09/26/2018 00:21    Review of systems not obtained due to patient factors. Blood pressure 132/82, pulse 73, temperature (S) (!)  95.6 F (35.3 C), temperature source Temporal, resp. rate 16, height 5\' 8"  (1.727 m), weight 86 kg, SpO2 100 %. The patient is unconscious.  She does not awaken to noxious stimuli.  Her pupils are fixed at 7 mm bilaterally.  Gaze is midline.  Corneal reflexes are negative bilaterally.  No evidence of a gag or cough reflex.  Oculocephalic reflex negative.  Some flicker movement of her right foot which appears to be reflexive.  No movement of her extremities otherwise to noxious stimuli.  Examination of her head demonstrates a palpable Ommaya reservoir in her right frontal region.  There is no evidence of obvious  fracture or bony abnormality.  Oropharynx nasopharynx and external auditory canals are clear.  Chest and abdomen appear benign.  Neck with midline airway.  No evidence of cervical spinal injury.  Assessment/Plan: Patient with traumatic brain injury with small interhemispheric subdural hematoma and small right frontal subdural hematoma.  Patient with a large amount of traumatic subarachnoid blood frontally bilaterally.  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  Basal cisterns are completely effaced.  Ventricles are small.  Brainstem is hypodense and there is evidence of hypodensity within the right cerebellar hemisphere and diencephalon.  Patient appears to have suffered a catastrophic neurologic injury secondary to trauma complicated by malignant cerebral edema with subsequent herniation.  Patient with no evidence of brain or brainstem function at this point.  Recommend ICU observation for further resuscitation and observation.  I do not recommend any type of interventions from a neurosurgical standpoint at this point.  I do not believe this is a survivable injury.  Mallie Mussel A Pakou Rainbow 09/26/2018, 12:29 AM

## 2018-09-26 NOTE — Progress Notes (Signed)
No new issues or problems overnight.  Patient afebrile.  Borderline hypotensive.  Not currently on pressors.  No awakening to noxious stimuli.  Pupils remain fixed at 6 mm bilaterally.  Corneal reflexes absent bilaterally.  Oculocephalic reflex absent.  No evidence of gag reflex.  Patient with weak cough reflex and some intermittent respiratory effort.  No movement of her extremities to noxious stimuli.  Patient with severe traumatic brain injury secondary to fall with malignant cerebral edema and evidence of bilateral uncal herniation.  Patient with minimal medullary brainstem function.  Continue current management.  Prognosis grave.

## 2018-09-26 NOTE — ED Provider Notes (Signed)
[facility] EMERGENCY DEPARTMENT Provider Note   CSN: [id number] Arrival date & time: 09/24/2018  2314     History   Chief Complaint Chief Complaint  Patient presents with  . Trauma    HPI Martha Green is a 58 y.o. female.  The history is provided by the EMS personnel.  Trauma Mechanism of injury: fall Injury location: head/neck Injury location detail: head Incident location: home (fell backwards on stairs ) Arrived directly from scene: yes   Fall:      Fall occurred: down stairs      Height of fall: 3      Impact surface: concrete      Point of impact: head      Entrapped after fall: no  Protective equipment:       No boots.       None  EMS/PTA data:      Bystander interventions: c collar.      Ambulatory at scene: no      Blood loss: minimal      Responsiveness: unresponsive      Loss of consciousness: yes      Airway interventions: none      Breathing interventions: oxygen      IV access: established      IO access: none      Fluids administered: none      Cardiac interventions: none      Medications administered: none      Immobilization: C-collar      Airway condition since incident: improving      Breathing condition since incident: improving      Circulation condition since incident: improving      Mental status condition since incident: stable      Disability condition since incident: stable  Current symptoms:      Associated symptoms:            Reports loss of consciousness.   Relevant PMH:      Medical risk factors:            No hemophilia.       Pharmacological risk factors:            Antiplatelet therapy.       The patient has not been admitted to the hospital due to injury in the past year, and has not been treated and released from the ED due to injury in the past year. Reportedly fell backwards striking head.  GCS 3, vomitus at the airway.   Past Medical History:  Diagnosis Date  . GERD (gastroesophageal reflux  disease)   . Hemispheric carotid artery syndrome   . 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 SAH (subarachnoid hemorrhage) ([place]) 09/26/2018    Past Surgical History:  Procedure Laterality Date  . VENTRICULOPERITONEAL SHUNT       OB History   No obstetric history on file.      Home Medications    Prior to Admission medications   Medication Sig Start Date End Date Taking? Authorizing Provider  Albuterol Sulfate (PROAIR RESPICLICK) 834 (90 Base) MCG/ACT AEPB Inhale into the lungs.    [provider]  ASPIRIN LOW DOSE 81 MG EC tablet Take 81 mg by mouth daily. 02/23/18   [provider]  atorvastatin (LIPITOR) 40 MG tablet Take by mouth.    [provider]  candesartan (ATACAND) 16 MG tablet TK 1/2 T PO QD  02/24/18   [provider]  cetirizine (ZYRTEC) 10 MG tablet Take 10 mg by mouth as needed. 02/23/18   [provider]  estradiol (ESTRACE) 0.1 MG/GM vaginal cream  03/26/18   [provider]  fluticasone (FLONASE) 50 MCG/ACT nasal spray Place into the nose.    [provider]  fluticasone (FLOVENT HFA) 110 MCG/ACT inhaler Inhale 1 puff into the lungs 2 (two) times daily as needed.    [provider]  MAGNESIUM-OXIDE 400 (241.3 Mg) MG tablet TK 1 T PO QD 02/23/18   [provider]  metoprolol succinate (TOPROL-XL) 25 MG 24 hr tablet TK 1 T PO QD 02/23/18   [provider]  montelukast (SINGULAIR) 10 MG tablet  03/28/18   [provider]  MYRBETRIQ 50 MG TB24 tablet  02/04/18   [provider]  pantoprazole (PROTONIX) 20 MG tablet Take by mouth. 12/30/17   [provider]  SYSTANE 0.4-0.3 % SOLN  03/30/18   [provider]    Family History Family History  Problem Relation Age of Onset  . Breast cancer Mother   . Lymphoma Father     Social History Social History   Tobacco Use  . Smoking status: Never Smoker    . Smokeless tobacco: Never Used  Substance Use Topics  . Alcohol use: Not Currently  . Drug use: Never     Allergies   Patient has no known allergies.   Review of Systems Review of Systems  Unable to perform ROS: Acuity of condition  Neurological: Positive for loss of consciousness.     Physical Exam Updated Vital Signs BP 132/82   Pulse 72   Temp (S) (!) 95.6 F (35.3 C) (Temporal)   Resp 18   Ht 5\' 8"  (1.727 m)   Wt 86 kg   SpO2 100%   BMI 28.83 kg/m   Physical Exam Vitals signs and nursing note reviewed.  Constitutional:      Appearance: She is obese.  HENT:     Head:     Comments: Abrasion of the occiput    Right Ear: Tympanic membrane normal.     Left Ear: Tympanic membrane normal.     Nose: Nose normal.     Mouth/Throat:     Comments: Vomitus  Eyes:     Comments: anisocoria right 6 mm left 4 mm non reactive  Neck:     Comments: c collar in place trachea midline Cardiovascular:     Rate and Rhythm: Normal rate and regular rhythm.     Pulses: Normal pulses.     Heart sounds: Normal heart sounds.  Pulmonary:     Breath sounds: Rhonchi present.  Abdominal:     General: Bowel sounds are normal.     Tenderness: There is no abdominal tenderness.  Musculoskeletal:        General: No deformity.  Skin:    General: Skin is warm and dry.     Capillary Refill: Capillary refill takes less than 2 seconds.  Neurological:     GCS: GCS eye subscore is 1. GCS verbal subscore is 1. GCS motor subscore is 1.  Psychiatric:     Comments: unable      ED Treatments / Results  Labs (all labs ordered are listed, but only abnormal results are displayed) Results for orders placed or performed during the hospital encounter of 09/12/2018  CDS serology  Result Value Ref Range   CDS serology specimen  SPECIMEN WILL BE HELD FOR 14 DAYS IF TESTING IS REQUIRED  CBC  Result Value Ref Range   WBC 14.7 (H) 4.0 - 10.5 K/uL   RBC 3.51 (L) 3.87 - 5.11 MIL/uL    Hemoglobin 10.7 (L) 12.0 - 15.0 g/dL   HCT 31.8 (L) 36.0 - 46.0 %   MCV 90.6 80.0 - 100.0 fL   MCH 30.5 26.0 - 34.0 pg   MCHC 33.6 30.0 - 36.0 g/dL   RDW 12.7 11.5 - 15.5 %   Platelets 301 150 - 400 K/uL   nRBC 0.0 0.0 - 0.2 %  Protime-INR  Result Value Ref Range   Prothrombin Time 13.4 11.4 - 15.2 seconds   INR 1.03   CBG monitoring, ED  Result Value Ref Range   Glucose-Capillary 124 (H) 70 - 99 mg/dL   Comment 1 Notify RN   Sample to Blood Bank  Result Value Ref Range   Blood Bank Specimen SAMPLE AVAILABLE FOR TESTING    Sample Expiration      09/26/2018 Performed at [facility], [address], [place], [place] [address]    Ct Head Wo Contrast  Result Date: 09/20/2018 CLINICAL DATA:  Fall backwards down stairs, hit head.  Unresponsive. EXAM: CT HEAD WITHOUT CONTRAST CT CERVICAL SPINE WITHOUT CONTRAST TECHNIQUE: Multidetector CT imaging of the head and cervical spine was performed following the standard protocol without intravenous contrast. Multiplanar CT image reconstructions of the cervical spine were also generated. COMPARISON:  None. FINDINGS: CT HEAD FINDINGS Brain: There is diffuse subarachnoid blood. This is most pronounced over the frontal lobes bilaterally. Blood is also noted along the tentorium with largest area posteriorly measuring up to 1.3 cm in thickness. This could be subarachnoid or subdural in location. No visible intraparenchymal hemorrhage. Right frontal VP shunt is in place. Ventricles are decompressed. No visible herniation. Vascular: Difficult to visualize the vessels due to the diffuse subarachnoid blood. Skull: No acute calvarial abnormality. Sinuses/Orbits: Coastal thickening throughout the paranasal sinuses. Mastoid air cells clear. Other: None CT CERVICAL SPINE FINDINGS Alignment: No subluxation. Skull base and vertebrae: No acute fracture. No primary bone lesion or focal pathologic process. Soft tissues and spinal canal: No prevertebral fluid or  swelling. No visible canal hematoma. Disc levels: Degenerative disc disease at C5-6 with disc space narrowing and spurring. Mild diffuse degenerative facet disease bilaterally, left greater than right. Upper chest: No acute findings. Other: None IMPRESSION: Diffuse subarachnoid hemorrhage, most notable over both frontal lobes. Interhemispheric blood along the falx, thickest posteriorly measuring up to 1.3 cm in thickness. No intraparenchymal hemorrhage.  No visible herniation currently. Degenerative disc and facet disease in the cervical spine. No acute bony abnormality. Critical Value/emergent results were discussed in person at the time of interpretation on 08/26/2018 at 11:54 pm with Dr. Georganna Skeans . Electronically Signed   By: Rolm Baptise M.D.   On: 08/30/2018 23:55   Ct Cervical Spine Wo Contrast  Result Date: 09/02/2018 CLINICAL DATA:  Fall backwards down stairs, hit head.  Unresponsive. EXAM: CT HEAD WITHOUT CONTRAST CT CERVICAL SPINE WITHOUT CONTRAST TECHNIQUE: Multidetector CT imaging of the head and cervical spine was performed following the standard protocol without intravenous contrast. Multiplanar CT image reconstructions of the cervical spine were also generated. COMPARISON:  None. FINDINGS: CT HEAD FINDINGS Brain: There is diffuse subarachnoid blood. This is most pronounced over the frontal lobes bilaterally. Blood is also noted along the tentorium with largest area posteriorly measuring up to 1.3 cm in thickness. This  could be subarachnoid or subdural in location. No visible intraparenchymal hemorrhage. Right frontal VP shunt is in place. Ventricles are decompressed. No visible herniation. Vascular: Difficult to visualize the vessels due to the diffuse subarachnoid blood. Skull: No acute calvarial abnormality. Sinuses/Orbits: Coastal thickening throughout the paranasal sinuses. Mastoid air cells clear. Other: None CT CERVICAL SPINE FINDINGS Alignment: No subluxation. Skull base and vertebrae:  No acute fracture. No primary bone lesion or focal pathologic process. Soft tissues and spinal canal: No prevertebral fluid or swelling. No visible canal hematoma. Disc levels: Degenerative disc disease at C5-6 with disc space narrowing and spurring. Mild diffuse degenerative facet disease bilaterally, left greater than right. Upper chest: No acute findings. Other: None IMPRESSION: Diffuse subarachnoid hemorrhage, most notable over both frontal lobes. Interhemispheric blood along the falx, thickest posteriorly measuring up to 1.3 cm in thickness. No intraparenchymal hemorrhage.  No visible herniation currently. Degenerative disc and facet disease in the cervical spine. No acute bony abnormality. Critical Value/emergent results were discussed in person at the time of interpretation on 09/15/2018 at 11:54 pm with Dr. Georganna Skeans . Electronically Signed   By: Rolm Baptise M.D.   On: 09/05/2018 23:55   Dg Pelvis Portable  Result Date: 09/08/2018 CLINICAL DATA:  Level 1 trauma. Unwitnessed fall tonight. Unresponsive. EXAM: PORTABLE PELVIS 1-2 VIEWS COMPARISON:  None. FINDINGS: There is no evidence of pelvic fracture or diastasis. No pelvic bone lesions are seen. IMPRESSION: Negative. Electronically Signed   By: Lucienne Capers M.D.   On: 08/28/2018 23:34   Dg Chest Port 1 View  Result Date: 08/29/2018 CLINICAL DATA:  Level 1 trauma.  Unwitnessed fall.  Unresponsive. EXAM: PORTABLE CHEST 1 VIEW COMPARISON:  None. FINDINGS: Endotracheal tube with tip measuring 2.8 cm above the carina. Shallow inspiration. The heart size and mediastinal contours are within normal limits. Both lungs are clear. The visualized skeletal structures are unremarkable. IMPRESSION: No active disease. Electronically Signed   By: Lucienne Capers M.D.   On: 09/22/2018 23:35    EKG None  Radiology Ct Head Wo Contrast  Result Date: 09/05/2018 CLINICAL DATA:  Fall backwards down stairs, hit head.  Unresponsive. EXAM: CT HEAD WITHOUT  CONTRAST CT CERVICAL SPINE WITHOUT CONTRAST TECHNIQUE: Multidetector CT imaging of the head and cervical spine was performed following the standard protocol without intravenous contrast. Multiplanar CT image reconstructions of the cervical spine were also generated. COMPARISON:  None. FINDINGS: CT HEAD FINDINGS Brain: There is diffuse subarachnoid blood. This is most pronounced over the frontal lobes bilaterally. Blood is also noted along the tentorium with largest area posteriorly measuring up to 1.3 cm in thickness. This could be subarachnoid or subdural in location. No visible intraparenchymal hemorrhage. Right frontal VP shunt is in place. Ventricles are decompressed. No visible herniation. Vascular: Difficult to visualize the vessels due to the diffuse subarachnoid blood. Skull: No acute calvarial abnormality. Sinuses/Orbits: Coastal thickening throughout the paranasal sinuses. Mastoid air cells clear. Other: None CT CERVICAL SPINE FINDINGS Alignment: No subluxation. Skull base and vertebrae: No acute fracture. No primary bone lesion or focal pathologic process. Soft tissues and spinal canal: No prevertebral fluid or swelling. No visible canal hematoma. Disc levels: Degenerative disc disease at C5-6 with disc space narrowing and spurring. Mild diffuse degenerative facet disease bilaterally, left greater than right. Upper chest: No acute findings. Other: None IMPRESSION: Diffuse subarachnoid hemorrhage, most notable over both frontal lobes. Interhemispheric blood along the falx, thickest posteriorly measuring up to 1.3 cm in thickness. No intraparenchymal hemorrhage.  No visible  herniation currently. Degenerative disc and facet disease in the cervical spine. No acute bony abnormality. Critical Value/emergent results were discussed in person at the time of interpretation on 09/10/2018 at 11:54 pm with Dr. Georganna Skeans . Electronically Signed   By: Rolm Baptise M.D.   On: 09/16/2018 23:55   Ct Cervical Spine Wo  Contrast  Result Date: 08/28/2018 CLINICAL DATA:  Fall backwards down stairs, hit head.  Unresponsive. EXAM: CT HEAD WITHOUT CONTRAST CT CERVICAL SPINE WITHOUT CONTRAST TECHNIQUE: Multidetector CT imaging of the head and cervical spine was performed following the standard protocol without intravenous contrast. Multiplanar CT image reconstructions of the cervical spine were also generated. COMPARISON:  None. FINDINGS: CT HEAD FINDINGS Brain: There is diffuse subarachnoid blood. This is most pronounced over the frontal lobes bilaterally. Blood is also noted along the tentorium with largest area posteriorly measuring up to 1.3 cm in thickness. This could be subarachnoid or subdural in location. No visible intraparenchymal hemorrhage. Right frontal VP shunt is in place. Ventricles are decompressed. No visible herniation. Vascular: Difficult to visualize the vessels due to the diffuse subarachnoid blood. Skull: No acute calvarial abnormality. Sinuses/Orbits: Coastal thickening throughout the paranasal sinuses. Mastoid air cells clear. Other: None CT CERVICAL SPINE FINDINGS Alignment: No subluxation. Skull base and vertebrae: No acute fracture. No primary bone lesion or focal pathologic process. Soft tissues and spinal canal: No prevertebral fluid or swelling. No visible canal hematoma. Disc levels: Degenerative disc disease at C5-6 with disc space narrowing and spurring. Mild diffuse degenerative facet disease bilaterally, left greater than right. Upper chest: No acute findings. Other: None IMPRESSION: Diffuse subarachnoid hemorrhage, most notable over both frontal lobes. Interhemispheric blood along the falx, thickest posteriorly measuring up to 1.3 cm in thickness. No intraparenchymal hemorrhage.  No visible herniation currently. Degenerative disc and facet disease in the cervical spine. No acute bony abnormality. Critical Value/emergent results were discussed in person at the time of interpretation on 09/22/2018 at  11:54 pm with Dr. Georganna Skeans . Electronically Signed   By: Rolm Baptise M.D.   On: 09/17/2018 23:55   Dg Pelvis Portable  Result Date: 08/27/2018 CLINICAL DATA:  Level 1 trauma. Unwitnessed fall tonight. Unresponsive. EXAM: PORTABLE PELVIS 1-2 VIEWS COMPARISON:  None. FINDINGS: There is no evidence of pelvic fracture or diastasis. No pelvic bone lesions are seen. IMPRESSION: Negative. Electronically Signed   By: Lucienne Capers M.D.   On: 08/28/2018 23:34   Dg Chest Port 1 View  Result Date: 09/03/2018 CLINICAL DATA:  Level 1 trauma.  Unwitnessed fall.  Unresponsive. EXAM: PORTABLE CHEST 1 VIEW COMPARISON:  None. FINDINGS: Endotracheal tube with tip measuring 2.8 cm above the carina. Shallow inspiration. The heart size and mediastinal contours are within normal limits. Both lungs are clear. The visualized skeletal structures are unremarkable. IMPRESSION: No active disease. Electronically Signed   By: Lucienne Capers M.D.   On: 09/16/2018 23:35    Procedures Procedure Name: Intubation Date/Time: 09/26/2018 12:18 AM Performed by: Veatrice Kells, MD Pre-anesthesia Checklist: Patient identified Oxygen Delivery Method: Ambu bag Preoxygenation: Pre-oxygenation with 100% oxygen Induction Type: Rapid sequence Ventilation: Mask ventilation without difficulty Laryngoscope Size: Glidescope and 4 Grade View: Grade IV Tube size: 7.5 mm Number of attempts: 1 Placement Confirmation: ETT inserted through vocal cords under direct vision,  Positive ETCO2,  Breath sounds checked- equal and bilateral and CO2 detector Tube secured with: ETT holder Dental Injury: Teeth and Oropharynx as per pre-operative assessment  Difficulty Due To: Difficulty was anticipated      (  including critical care time)  Medications Ordered in ED Medications  propofol (DIPRIVAN) 1000 MG/100ML infusion (5 mcg/kg/min  86 kg Intravenous New Bag/Given 09/18/2018 2323)  etomidate (AMIDATE) injection (20 mg Intravenous Given  08/29/2018 2315)  rocuronium (ZEMURON) injection (80 mg Intravenous Given 09/02/2018 2315)     MDM Reviewed: nursing note and vitals Interpretation: labs and x-ray (NACPD on CXR by CT SDH and SAH by me) Total time providing critical care: 30-74 minutes. This excludes time spent performing separately reportable procedures and services. Consults: neurosurgery   CRITICAL CARE Performed by: Mazella Deen K Sonakshi Rolland-Rasch Total critical care time: 31 minutes Critical care time was exclusive of separately billable procedures and treating other patients. Critical care was necessary to treat or prevent imminent or life-threatening deterioration. Critical care was time spent personally by me on the following activities: development of treatment plan with patient and/or surrogate as well as nursing, discussions with consultants, evaluation of patient's response to treatment, examination of patient, obtaining history from patient or surrogate, ordering and performing treatments and interventions, ordering and review of laboratory studies, ordering and review of radiographic studies, pulse oximetry and re-evaluation of patient's condition.  Final Clinical Impressions(s) / ED Diagnoses   Final diagnoses:  Respiratory arrest ([place])  Subdural hematoma ([place])  Subarachnoid hemorrhage ([place])  Fall, initial encounter    Admit to ICU   Karyss Frese, MD 09/26/18 7092

## 2018-09-26 NOTE — H&P (Addendum)
Martha Green is an 58 y.o. female.   Chief Complaint: fall HPI: Patient was dropped off at her house by her friends.  She was witnessed to fall backwards while walking up the front steps.  She was unresponsive at the scene.  She was transported as a level 1 trauma by EMS.  On arrival, GCS was 3.  She was intubated by the emergency department physician.  Past Medical History:  Diagnosis Date  . GERD (gastroesophageal reflux disease)   . Hemispheric carotid artery syndrome   . Lymphoid leukemia in remission [facility])     Past Surgical History:  Procedure Laterality Date  . VENTRICULOPERITONEAL SHUNT      Family History  Problem Relation Age of Onset  . Breast cancer Mother   . Lymphoma Father    Social History:  reports that she has never smoked. She has never used smokeless tobacco. She reports previous alcohol use. She reports that she does not use drugs.  Allergies: No Known Allergies  (Not in a hospital admission)   Results for orders placed or performed during the hospital encounter of 09/16/2018 (from the past 48 hour(s))  CBG monitoring, ED     Status: Abnormal   Collection Time: 09/07/2018 11:17 PM  Result Value Ref Range   Glucose-Capillary 124 (H) 70 - 99 mg/dL   Comment 1 Notify RN   Sample to Blood Bank     Status: None   Collection Time: 09/22/2018 11:18 PM  Result Value Ref Range   Blood Bank Specimen SAMPLE AVAILABLE FOR TESTING    Sample Expiration      09/26/2018 Performed at [facility], [place] [address] [place], [place] [address]   CBC     Status: Abnormal   Collection Time: 09/06/2018 11:26 PM  Result Value Ref Range   WBC 14.7 (H) 4.0 - 10.5 K/uL   RBC 3.51 (L) 3.87 - 5.11 MIL/uL   Hemoglobin 10.7 (L) 12.0 - 15.0 g/dL   HCT 31.8 (L) 36.0 - 46.0 %   MCV 90.6 80.0 - 100.0 fL   MCH 30.5 26.0 - 34.0 pg   MCHC 33.6 30.0 - 36.0 g/dL   RDW 12.7 11.5 - 15.5 %   Platelets 301 150 - 400 K/uL   nRBC 0.0 0.0 - 0.2 %    Comment: Performed at [facility], [place] [address] [place], [place] [address]  Protime-INR     Status: None   Collection Time: 09/20/2018 11:26 PM  Result Value Ref Range   Prothrombin Time 13.4 11.4 - 15.2 seconds   INR 1.03     Comment: Performed at [facility] [address], [place], [place] [address]   Ct Head Wo Contrast  Result Date: 09/14/2018 CLINICAL DATA:  Fall backwards down stairs, hit head.  Unresponsive. EXAM: CT HEAD WITHOUT CONTRAST CT CERVICAL SPINE WITHOUT CONTRAST TECHNIQUE: Multidetector CT imaging of the head and cervical spine was performed following the standard protocol without intravenous contrast. Multiplanar CT image reconstructions of the cervical spine were also generated. COMPARISON:  None. FINDINGS: CT HEAD FINDINGS Brain: There is diffuse subarachnoid blood. This is most pronounced over the frontal lobes bilaterally. Blood is also noted along the tentorium with largest area posteriorly measuring up to 1.3 cm in thickness. This could be subarachnoid or subdural in location. No visible intraparenchymal hemorrhage. Right frontal VP shunt is in place. Ventricles are decompressed. No visible herniation. Vascular: Difficult to visualize the vessels due to the diffuse  subarachnoid blood. Skull: No acute calvarial abnormality. Sinuses/Orbits: Coastal thickening throughout the paranasal sinuses. Mastoid air cells clear. Other: None CT CERVICAL SPINE FINDINGS Alignment: No subluxation. Skull base and vertebrae: No acute fracture. No primary bone lesion or focal pathologic process. Soft tissues and spinal canal: No prevertebral fluid or swelling. No visible canal hematoma. Disc levels: Degenerative disc disease at C5-6 with disc space narrowing and spurring. Mild diffuse degenerative facet disease bilaterally, left greater than right. Upper chest: No acute findings. Other: None IMPRESSION: Diffuse subarachnoid hemorrhage, most notable over both frontal lobes. Interhemispheric blood along the falx, thickest  posteriorly measuring up to 1.3 cm in thickness. No intraparenchymal hemorrhage.  No visible herniation currently. Degenerative disc and facet disease in the cervical spine. No acute bony abnormality. Critical Value/emergent results were discussed in person at the time of interpretation on 08/31/2018 at 11:54 pm with Dr. Georganna Skeans . Electronically Signed   By: Rolm Baptise M.D.   On: 09/13/2018 23:55   Ct Cervical Spine Wo Contrast  Result Date: 08/31/2018 CLINICAL DATA:  Fall backwards down stairs, hit head.  Unresponsive. EXAM: CT HEAD WITHOUT CONTRAST CT CERVICAL SPINE WITHOUT CONTRAST TECHNIQUE: Multidetector CT imaging of the head and cervical spine was performed following the standard protocol without intravenous contrast. Multiplanar CT image reconstructions of the cervical spine were also generated. COMPARISON:  None. FINDINGS: CT HEAD FINDINGS Brain: There is diffuse subarachnoid blood. This is most pronounced over the frontal lobes bilaterally. Blood is also noted along the tentorium with largest area posteriorly measuring up to 1.3 cm in thickness. This could be subarachnoid or subdural in location. No visible intraparenchymal hemorrhage. Right frontal VP shunt is in place. Ventricles are decompressed. No visible herniation. Vascular: Difficult to visualize the vessels due to the diffuse subarachnoid blood. Skull: No acute calvarial abnormality. Sinuses/Orbits: Coastal thickening throughout the paranasal sinuses. Mastoid air cells clear. Other: None CT CERVICAL SPINE FINDINGS Alignment: No subluxation. Skull base and vertebrae: No acute fracture. No primary bone lesion or focal pathologic process. Soft tissues and spinal canal: No prevertebral fluid or swelling. No visible canal hematoma. Disc levels: Degenerative disc disease at C5-6 with disc space narrowing and spurring. Mild diffuse degenerative facet disease bilaterally, left greater than right. Upper chest: No acute findings. Other: None  IMPRESSION: Diffuse subarachnoid hemorrhage, most notable over both frontal lobes. Interhemispheric blood along the falx, thickest posteriorly measuring up to 1.3 cm in thickness. No intraparenchymal hemorrhage.  No visible herniation currently. Degenerative disc and facet disease in the cervical spine. No acute bony abnormality. Critical Value/emergent results were discussed in person at the time of interpretation on 09/16/2018 at 11:54 pm with Dr. Georganna Skeans . Electronically Signed   By: Rolm Baptise M.D.   On: 09/07/2018 23:55   Dg Pelvis Portable  Result Date: 09/03/2018 CLINICAL DATA:  Level 1 trauma. Unwitnessed fall tonight. Unresponsive. EXAM: PORTABLE PELVIS 1-2 VIEWS COMPARISON:  None. FINDINGS: There is no evidence of pelvic fracture or diastasis. No pelvic bone lesions are seen. IMPRESSION: Negative. Electronically Signed   By: Lucienne Capers M.D.   On: 09/17/2018 23:34   Dg Chest Port 1 View  Result Date: 09/22/2018 CLINICAL DATA:  Level 1 trauma.  Unwitnessed fall.  Unresponsive. EXAM: PORTABLE CHEST 1 VIEW COMPARISON:  None. FINDINGS: Endotracheal tube with tip measuring 2.8 cm above the carina. Shallow inspiration. The heart size and mediastinal contours are within normal limits. Both lungs are clear. The visualized skeletal structures are unremarkable. IMPRESSION: No active disease. Electronically  Signed   By: Lucienne Capers M.D.   On: 09/02/2018 23:35    Review of Systems  Unable to perform ROS: Intubated    Blood pressure 132/82, pulse 72, temperature (S) (!) 95.6 F (35.3 C), temperature source Temporal, resp. rate 18, height 5\' 8"  (1.727 m), weight 86 kg, SpO2 100 %. Physical Exam  Constitutional: She appears well-developed and well-nourished.  HENT:  Small abrasion occipital scalp  Eyes:  R pupil 6 and L pupil 5 NR  Neck: No tracheal deviation present. No thyromegaly present.  Cardiovascular: Normal rate, normal heart sounds and intact distal pulses.   Respiratory: Effort normal and breath sounds normal. No respiratory distress. She has no wheezes. She has no rales.  GI: Soft. She exhibits no distension. There is no abdominal tenderness. There is no rebound and no guarding.  Musculoskeletal:        General: No deformity or edema.  Neurological: She displays no atrophy and no tremor. She exhibits normal muscle tone. She displays no seizure activity. GCS eye subscore is 1. GCS verbal subscore is 1. GCS motor subscore is 1.  +gag  Skin: Skin is warm.     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 He son is in [place] and will be coming.  Critical care 67min  Zenovia Jarred, MD 09/26/2018, 12:04 AM

## 2018-09-26 NOTE — Progress Notes (Signed)
RT transported from ED to 4N without any complications.

## 2018-09-26 NOTE — ED Notes (Signed)
Neuro surgery at bedside.

## 2018-09-26 NOTE — Progress Notes (Signed)
Subjective/Chief Complaint: Minimal cough reflex   Objective: Vital signs in last 24 hours: Temp:  [94.1 F (34.5 C)-97.6 F (36.4 C)] 97.6 F (36.4 C) (02/01 0800) Pulse Rate:  [65-137] 127 (02/01 0900) Resp:  [11-20] 16 (02/01 0900) BP: (80-166)/(67-112) 107/81 (02/01 0900) SpO2:  [99 %-100 %] 100 % (02/01 0900) FiO2 (%):  [40 %-100 %] 40 % (02/01 0900) Weight:  [86 kg] 86 kg (01/31 2328)    Intake/Output from previous day: 01/31 0701 - 02/01 0700 In: 215.3 [I.V.:215.3] Out: 1075 [Urine:1075] Intake/Output this shift: Total I/O In: 70.7 [I.V.:70.7] Out: -   GCS 3 Pupils 6 mm fixed bilaterally  Lab Results:  Recent Labs    09/07/2018 2326  09/26/18 0347 09/26/18 0802  WBC 14.7*  --   --  13.3*  HGB 10.7*   < > 11.9* 10.7*  HCT 31.8*   < > 35.0* 31.5*  PLT 301  --   --  291   < > = values in this interval not displayed.   BMET Recent Labs    09/02/2018 2326  09/26/18 0347 09/26/18 0802  NA 122*   < > 124* 128*  K 2.7*   < > 3.1* 3.5  CL 88*  --   --  94*  CO2 19*  --   --  21*  GLUCOSE 133*  --   --  117*  BUN 9  --   --  9  CREATININE 0.84  --   --  0.82  CALCIUM 8.8*  --   --  9.1   < > = values in this interval not displayed.   PT/INR Recent Labs    09/22/2018 2326  LABPROT 13.4  INR 1.03   ABG Recent Labs    09/26/18 0025 09/26/18 0347  PHART 7.434 7.363  HCO3 19.2* 21.1    Studies/Results: Ct Head Wo Contrast  Result Date: 08/27/2018 CLINICAL DATA:  Fall backwards down stairs, hit head.  Unresponsive. EXAM: CT HEAD WITHOUT CONTRAST CT CERVICAL SPINE WITHOUT CONTRAST TECHNIQUE: Multidetector CT imaging of the head and cervical spine was performed following the standard protocol without intravenous contrast. Multiplanar CT image reconstructions of the cervical spine were also generated. COMPARISON:  None. FINDINGS: CT HEAD FINDINGS Brain: There is diffuse subarachnoid blood. This is most pronounced over the frontal lobes bilaterally.  Blood is also noted along the tentorium with largest area posteriorly measuring up to 1.3 cm in thickness. This could be subarachnoid or subdural in location. No visible intraparenchymal hemorrhage. Right frontal VP shunt is in place. Ventricles are decompressed. No visible herniation. Vascular: Difficult to visualize the vessels due to the diffuse subarachnoid blood. Skull: No acute calvarial abnormality. Sinuses/Orbits: Coastal thickening throughout the paranasal sinuses. Mastoid air cells clear. Other: None CT CERVICAL SPINE FINDINGS Alignment: No subluxation. Skull base and vertebrae: No acute fracture. No primary bone lesion or focal pathologic process. Soft tissues and spinal canal: No prevertebral fluid or swelling. No visible canal hematoma. Disc levels: Degenerative disc disease at C5-6 with disc space narrowing and spurring. Mild diffuse degenerative facet disease bilaterally, left greater than right. Upper chest: No acute findings. Other: None IMPRESSION: Diffuse subarachnoid hemorrhage, most notable over both frontal lobes. Interhemispheric blood along the falx, thickest posteriorly measuring up to 1.3 cm in thickness. No intraparenchymal hemorrhage.  No visible herniation currently. Degenerative disc and facet disease in the cervical spine. No acute bony abnormality. Critical Value/emergent results were discussed in person at the time of interpretation on  09/16/2018 at 11:54 pm with Dr. Georganna Skeans . Electronically Signed   By: Rolm Baptise M.D.   On: 09/04/2018 23:55   Ct Cervical Spine Wo Contrast  Result Date: 09/24/2018 CLINICAL DATA:  Fall backwards down stairs, hit head.  Unresponsive. EXAM: CT HEAD WITHOUT CONTRAST CT CERVICAL SPINE WITHOUT CONTRAST TECHNIQUE: Multidetector CT imaging of the head and cervical spine was performed following the standard protocol without intravenous contrast. Multiplanar CT image reconstructions of the cervical spine were also generated. COMPARISON:  None.  FINDINGS: CT HEAD FINDINGS Brain: There is diffuse subarachnoid blood. This is most pronounced over the frontal lobes bilaterally. Blood is also noted along the tentorium with largest area posteriorly measuring up to 1.3 cm in thickness. This could be subarachnoid or subdural in location. No visible intraparenchymal hemorrhage. Right frontal VP shunt is in place. Ventricles are decompressed. No visible herniation. Vascular: Difficult to visualize the vessels due to the diffuse subarachnoid blood. Skull: No acute calvarial abnormality. Sinuses/Orbits: Coastal thickening throughout the paranasal sinuses. Mastoid air cells clear. Other: None CT CERVICAL SPINE FINDINGS Alignment: No subluxation. Skull base and vertebrae: No acute fracture. No primary bone lesion or focal pathologic process. Soft tissues and spinal canal: No prevertebral fluid or swelling. No visible canal hematoma. Disc levels: Degenerative disc disease at C5-6 with disc space narrowing and spurring. Mild diffuse degenerative facet disease bilaterally, left greater than right. Upper chest: No acute findings. Other: None IMPRESSION: Diffuse subarachnoid hemorrhage, most notable over both frontal lobes. Interhemispheric blood along the falx, thickest posteriorly measuring up to 1.3 cm in thickness. No intraparenchymal hemorrhage.  No visible herniation currently. Degenerative disc and facet disease in the cervical spine. No acute bony abnormality. Critical Value/emergent results were discussed in person at the time of interpretation on 09/04/2018 at 11:54 pm with Dr. Georganna Skeans . Electronically Signed   By: Rolm Baptise M.D.   On: 09/23/2018 23:55   Dg Pelvis Portable  Result Date: 09/24/2018 CLINICAL DATA:  Level 1 trauma. Unwitnessed fall tonight. Unresponsive. EXAM: PORTABLE PELVIS 1-2 VIEWS COMPARISON:  None. FINDINGS: There is no evidence of pelvic fracture or diastasis. No pelvic bone lesions are seen. IMPRESSION: Negative. Electronically  Signed   By: Lucienne Capers M.D.   On: 09/08/2018 23:34   Dg Chest Port 1 View  Result Date: 08/31/2018 CLINICAL DATA:  Level 1 trauma.  Unwitnessed fall.  Unresponsive. EXAM: PORTABLE CHEST 1 VIEW COMPARISON:  None. FINDINGS: Endotracheal tube with tip measuring 2.8 cm above the carina. Shallow inspiration. The heart size and mediastinal contours are within normal limits. Both lungs are clear. The visualized skeletal structures are unremarkable. IMPRESSION: No active disease. Electronically Signed   By: Lucienne Capers M.D.   On: 09/09/2018 23:35   Dg Abd Portable 1v  Result Date: 09/26/2018 CLINICAL DATA:  OG tube placement EXAM: PORTABLE ABDOMEN - 1 VIEW COMPARISON:  None. FINDINGS: Enteric tube tip is in the upper mid abdomen consistent with location in the body of the stomach. Bowel gas pattern is normal with scattered gas in the colon. No small or large bowel distention. Visualized bones appear intact. No radiopaque stones. IMPRESSION: Enteric tube tip is in the upper mid abdomen consistent with location in the body of the stomach. Nonobstructive bowel gas pattern. Electronically Signed   By: Lucienne Capers M.D.   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

## 2018-09-26 DEATH — deceased

## 2018-09-27 ENCOUNTER — Inpatient Hospital Stay (HOSPITAL_COMMUNITY)

## 2018-09-27 MED ORDER — ALBUMIN HUMAN 5 % IV SOLN
25.0000 g | Freq: Once | INTRAVENOUS | Status: AC
  Administered 2018-09-27: 25 g via INTRAVENOUS
  Filled 2018-09-27: qty 500

## 2018-09-27 MED ORDER — ALBUMIN HUMAN 25 % IV SOLN: 25.0000 g | Freq: Once | INTRAVENOUS | Status: DC

## 2018-09-27 NOTE — Progress Notes (Signed)
Pt transported to and from 4N 26 to CT on vent. Pt stable throughout without complications. VS within normal limits

## 2018-09-27 NOTE — Progress Notes (Signed)
Patient ID: Martha Green, female   DOB: [birth year], 58 y.o.   MRN: [id number] Follow up - Trauma Critical Care  Patient Details:    Martha Green is an 58 y.o. female.  Lines/tubes : Airway 7.5 mm (Active)  Secured at (cm) 24 cm 09/27/2018  7:37 AM  Measured From Lips 09/27/2018  7:37 AM  Secured Location Center 09/27/2018  7:37 AM  Secured By [employer] 09/27/2018  7:37 AM  Tube Holder Repositioned Yes 09/27/2018  7:37 AM  Cuff Pressure (cm H2O) 26 cm H2O 09/27/2018  7:37 AM  Site Condition Dry 09/27/2018  7:37 AM     NG/OG Tube Orogastric 18 Fr. Center mouth Xray;Aucultation (Active)  Site Assessment Clean;Dry;Intact 09/26/2018  8:00 PM  Ongoing Placement Verification No acute changes, not attributed to clinical condition;No change in respiratory status;No change in cm markings or external length of tube from initial placement 09/26/2018  8:00 PM  Status Suction-low intermittent 09/26/2018  8:00 PM  Drainage Appearance Green;Thick 09/26/2018  8:00 PM  Output (mL) 700 mL 09/27/2018  3:00 AM     Urethral Catheter Phong  Straight-tip 14 Fr. (Active)  Indication for Insertion or Continuance of Catheter Unstable critical patients (first 24-48 hours) 09/27/2018  8:00 AM  Site Assessment Clean;Intact 09/26/2018  8:00 PM  Catheter Maintenance Bag below level of bladder;Catheter secured;Drainage bag/tubing not touching floor;Insertion date on drainage bag;No dependent loops;Seal intact 09/26/2018  8:00 PM  Securement Method Securing device (Describe) 09/26/2018  8:00 PM  Urinary Catheter Interventions Unclamped 09/26/2018  8:00 PM  Input (mL) 1100 mL 09/26/2018  1:00 PM  Output (mL) 750 mL 09/27/2018  3:00 AM    Microbiology/Sepsis markers: Results for orders placed or performed during the hospital encounter of 09/13/2018  MRSA PCR Screening     Status: None   Collection Time: 09/26/18 12:51 AM  Result Value Ref Range Status   MRSA by PCR NEGATIVE NEGATIVE Final    Comment:        The GeneXpert MRSA Assay  (FDA approved for NASAL specimens only), is one component of a comprehensive MRSA colonization surveillance program. It is not intended to diagnose MRSA infection nor to guide or monitor treatment for MRSA infections. Performed at [facility], [place] [address], [place], [place] [address]     Anti-infectives:  Anti-infectives (From admission, onward)   None      Best Practice/Protocols:  VTE Prophylaxis: Mechanical .  Consults: Treatment Team:  Earnie Larsson, MD    Studies:    Events:  Subjective:    Overnight Issues:   Objective:  Vital signs for last 24 hours: Temp:  [98 F (36.7 C)-100.7 F (38.2 C)] 100.7 F (38.2 C) (02/02 0800) Pulse Rate:  [114-140] 125 (02/02 0800) Resp:  [15-16] 16 (02/02 0800) BP: (92-144)/(18-99) 126/87 (02/02 0800) SpO2:  [99 %-100 %] 99 % (02/02 0800) FiO2 (%):  [40 %] 40 % (02/02 0737)  Hemodynamic parameters for last 24 hours:    Intake/Output from previous day: 02/01 0701 - 02/02 0700 In: 3473 [I.V.:2373] Out: 1450 [Urine:750; Emesis/NG output:700]  Intake/Output this shift: No intake/output data recorded.  Vent settings for last 24 hours: Vent Mode: PRVC FiO2 (%):  [40 %] 40 % Set Rate:  [16 bmp] 16 bmp Vt Set:  [510 mL] 510 mL PEEP:  [5 cmH20] 5 cmH20 Plateau Pressure:  [13 cmH20-15 cmH20] 15 cmH20  Physical Exam:  General: on vent Neuro: pupils fixed and dilated, no corneal, extensor postures BLE HEENT/Neck:  ETT and collar Resp: clear to auscultation bilaterally CVS: RRR 120 GI: soft, nontender, BS WNL, no r/g Extremities: no edema, no erythema, pulses WNL  No results found for this or any previous visit (from the past 24 hour(s)).  Assessment & Plan: Present on Admission: **None**    LOS: 1 day   Additional comments:I reviewed the patient's new clinical lab test results. . 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 They are considering extubation/comfort care. Her other son will be here this afternoon. CDS aware.  Critical Care Total Time*: 37 Minutes  Georganna Skeans, MD, MPH, [facility] Trauma: [phone] General Surgery: [phone]  09/27/2018  *Care during the described time interval was provided by me. I have reviewed this patient's available data, including medical history, events of note, physical examination and test results as part of my evaluation.

## 2018-09-27 NOTE — Progress Notes (Signed)
Unchanged from yesterday.  Hemodynamically stable.  Afebrile.  No eye-opening to noxious stimuli.  Pupils remain fixed and dilated.  Corneal reflexes absent.  Patient still retains weak cough reflex and some spontaneous respiratory effort.  Patient extends her extremities to noxious stimuli.  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  Patient with likely irreversible brain and brainstem injury.  Patient with some residual medullary function.  Plan to check follow-up head CT scan for confirmation of progression of her infarcts.  This will be of benefit with regard to the family's understanding of her condition and ability to make further decisions with regard to limits or additional care.

## 2018-09-28 LAB — BASIC METABOLIC PANEL
Anion gap: 10 (ref 5–15)
BUN: 9 mg/dL (ref 6–20)
CO2: 19 mmol/L — ABNORMAL LOW (ref 22–32)
Calcium: 8 mg/dL — ABNORMAL LOW (ref 8.9–10.3)
Chloride: 107 mmol/L (ref 98–111)
Creatinine, Ser: 0.85 mg/dL (ref 0.44–1.00)
GFR calc Af Amer: >60 mL/min (ref >60)
GFR calc non Af Amer: >60 mL/min (ref >60)
Glucose, Bld: 117 mg/dL — ABNORMAL HIGH (ref 70–99)
POTASSIUM: 3.8 mmol/L (ref 3.5–5.1)
Sodium: 136 mmol/L (ref 135–145)

## 2018-09-28 LAB — CBC
HCT: 26.3 % — ABNORMAL LOW (ref 36.0–46.0)
Hemoglobin: 8.3 g/dL — ABNORMAL LOW (ref 12.0–15.0)
MCH: 29.3 pg (ref 26.0–34.0)
MCHC: 31.6 g/dL (ref 30.0–36.0)
MCV: 92.9 fL (ref 80.0–100.0)
Platelets: 193 10*3/uL (ref 150–400)
RBC: 2.83 MIL/uL — ABNORMAL LOW (ref 3.87–5.11)
RDW: 13.3 % (ref 11.5–15.5)
WBC: 10.3 10*3/uL (ref 4.0–10.5)
nRBC: 0 % (ref 0.0–0.2)

## 2018-09-28 MED ORDER — ONDANSETRON HCL 4 MG/2ML IJ SOLN: 4.0000 mg | Freq: Four times a day (QID) | INTRAMUSCULAR | Status: DC | PRN

## 2018-09-28 MED ORDER — POLYVINYL ALCOHOL 1.4 % OP SOLN
1.0000 [drp] | Freq: Four times a day (QID) | OPHTHALMIC | Status: DC | PRN
  Filled 2018-09-28: qty 15

## 2018-09-28 MED ORDER — ACETAMINOPHEN 650 MG RE SUPP: 650.0000 mg | Freq: Four times a day (QID) | RECTAL | Status: DC | PRN

## 2018-09-28 MED ORDER — GLYCOPYRROLATE 0.2 MG/ML IJ SOLN: 0.2000 mg | INTRAMUSCULAR | Status: DC | PRN

## 2018-09-28 MED ORDER — HALOPERIDOL LACTATE 5 MG/ML IJ SOLN: 0.5000 mg | INTRAMUSCULAR | Status: DC | PRN

## 2018-09-28 MED ORDER — BIOTENE DRY MOUTH MT LIQD: 15.0000 mL | OROMUCOSAL | Status: DC | PRN

## 2018-09-28 MED ORDER — ONDANSETRON 4 MG PO TBDP: 4.0000 mg | ORAL_TABLET | Freq: Four times a day (QID) | ORAL | Status: DC | PRN

## 2018-09-28 MED ORDER — GLYCOPYRROLATE 1 MG PO TABS
1.0000 mg | ORAL_TABLET | ORAL | Status: DC | PRN
  Filled 2018-09-28: qty 1

## 2018-09-28 MED ORDER — HALOPERIDOL 0.5 MG PO TABS
0.5000 mg | ORAL_TABLET | ORAL | Status: DC | PRN
  Filled 2018-09-28: qty 1

## 2018-09-28 MED ORDER — ACETAMINOPHEN 325 MG PO TABS: 650.0000 mg | ORAL_TABLET | Freq: Four times a day (QID) | ORAL | Status: DC | PRN

## 2018-09-28 MED ORDER — HALOPERIDOL LACTATE 2 MG/ML PO CONC
0.5000 mg | ORAL | Status: DC | PRN
  Filled 2018-09-28: qty 0.3

## 2018-10-25 NOTE — Progress Notes (Signed)
Patient's time of death was 73 and was confirmed by this RN and Gerlene Burdock. MD paged twice. CDS and ME were notified. Family was at the bedside. Waiting to complete the body prep after ME sees patient. Xitlalli Newhard, Rande Brunt, RN

## 2018-10-25 NOTE — Discharge Summary (Signed)
Physician Discharge Summary  Patient ID: Martha Green MRN: [id number] DOB/AGE: 1961/05/08 58 y.o.  Admit date: 09/16/2018 Discharge date: 09/29/2018  Admission Diagnoses:  Discharge Diagnoses:  Active Problems:   SAH (subarachnoid hemorrhage) [facility])   Discharged Condition: Deceased  Hospital Course: Patient admitted to the hospital for treatment of a severe traumatic brain injury.  Work-up demonstrated evidence of severe supratentorial edema with associated traumatic subarachnoid hemorrhage and some interhemispheric subdural hemorrhage.  Upon presentation to the emergency room the patient had no evidence of brain or brainstem function.  She was admitted for observation.  Given the CT scan findings and her clinical exam the prognosis was assumed to be very grave.  The next morning the patient did have a slight amount of respiratory effort in a week cough reflex.  She never improved from this point.  Follow-up scan was obtained which demonstrated the widespread infarction of her supratentorial compartment as well as brainstem infarction and Duret hemorrhages within her brainstem.  The family and I had a discussion with regard to possibilities for improvement in limits of care.  They decided that she would not want to be maintained in a long-term comatose state and they realize that she had minimal chance of improvement or survival.  They recommended and I agreed with withdrawing life support.  The patient was extubated and she passed shortly thereafter.  Consults:   Significant Diagnostic Studies:   Treatments:   Discharge Exam: Blood pressure 137/78, pulse 98, temperature 99.2 F (37.3 C), temperature source Axillary, resp. rate (!) 9, height 5\' 8"  (1.727 m), weight 86 kg, SpO2 (!) 18 %. Deceased Disposition:    Allergies as of 2018-10-04   No Known Allergies     Medication List    ASK your doctor about these medications   ASPIRIN LOW DOSE 81 MG EC tablet Generic drug:  aspirin Take 81  mg by mouth daily.   atorvastatin 40 MG tablet Commonly known as:  LIPITOR Take by mouth.   candesartan 16 MG tablet Commonly known as:  ATACAND TK 1/2 T PO QD   cetirizine 10 MG tablet Commonly known as:  ZYRTEC Take 10 mg by mouth as needed.   estradiol 0.1 MG/GM vaginal cream Commonly known as:  ESTRACE   fluticasone 110 MCG/ACT inhaler Commonly known as:  FLOVENT HFA Inhale 1 puff into the lungs 2 (two) times daily as needed.   fluticasone 50 MCG/ACT nasal spray Commonly known as:  FLONASE Place into the nose.   MAGNESIUM-OXIDE 400 (241.3 Mg) MG tablet Generic drug:  magnesium oxide TK 1 T PO QD   metoprolol succinate 25 MG 24 hr tablet Commonly known as:  TOPROL-XL TK 1 T PO QD   montelukast 10 MG tablet Commonly known as:  SINGULAIR   MYRBETRIQ 50 MG Tb24 tablet Generic drug:  mirabegron ER   pantoprazole 20 MG tablet Commonly known as:  PROTONIX Take by mouth.   PROAIR RESPICLICK 378 (90 Base) MCG/ACT Aepb Generic drug:  Albuterol Sulfate Inhale into the lungs.   SYSTANE 0.4-0.3 % Soln Generic drug:  Polyethyl Glycol-Propyl Glycol        Signed: Cooper Render Jillane Po 09/29/2018, 10:31 AM

## 2018-10-25 NOTE — Progress Notes (Signed)
Withdrew care with pt's husband at the bedside. Sons are in the hallway. Monitoring from a distance. Breeonna Mone, Rande Brunt, RN

## 2018-10-25 NOTE — Progress Notes (Signed)
RT NOTE: RT one-way extubated patient with RN at bedside per MD order and family wishes.  

## 2018-10-25 NOTE — Progress Notes (Signed)
Follow up - Trauma and Critical Care  Patient Details:    Martha Green is an 58 y.o. female.  Lines/tubes : Airway 7.5 mm (Active)  Secured at (cm) 24 cm Oct 11, 2018  7:52 AM  Measured From Lips 11-Oct-2018  7:52 AM  [place] 2018-10-11  7:52 AM  Secured By [employer] 10/11/18  7:52 AM  Tube Holder Repositioned Yes 2018-10-11  7:52 AM  Cuff Pressure (cm H2O) 28 cm H2O October 11, 2018  7:52 AM  Site Condition Dry 2018-10-11  7:52 AM     NG/OG Tube Orogastric 18 Fr. Center mouth Xray;Aucultation (Active)  Site Assessment Clean;Dry;Intact 09/27/2018  8:00 PM  Ongoing Placement Verification No acute changes, not attributed to clinical condition;No change in respiratory status;No change in cm markings or external length of tube from initial placement 09/27/2018  8:00 PM  Status Suction-low intermittent 09/27/2018  8:00 PM  Amount of suction 77 mmHg 09/27/2018  8:00 PM  Drainage Appearance Brown 09/27/2018  8:00 PM  Output (mL) 700 mL 09/27/2018  3:00 AM     Urethral Catheter Phong  Straight-tip 14 Fr. (Active)  Indication for Insertion or Continuance of Catheter Unstable critical patients (first 24-48 hours);Other (comment) 10/11/2018  7:53 AM  Site Assessment Clean;Intact 09/27/2018  8:00 PM  Catheter Maintenance Bag below level of bladder;Catheter secured;Drainage bag/tubing not touching floor;Insertion date on drainage bag;No dependent loops;Seal intact 10/11/18  7:54 AM  Securement Method Securing device (Describe) 09/27/2018  8:00 PM  Urinary Catheter Interventions Unclamped 09/27/2018  8:00 PM  Input (mL) 75 mL 09/27/2018 12:00 PM  Output (mL) 725 mL 10-11-18  1:00 AM    Microbiology/Sepsis markers: Results for orders placed or performed during the hospital encounter of 09/15/2018  MRSA PCR Screening     Status: None   Collection Time: 09/26/18 12:51 AM  Result Value Ref Range Status   MRSA by PCR NEGATIVE NEGATIVE Final    Comment:        The GeneXpert MRSA Assay (FDA approved for NASAL  specimens only), is one component of a comprehensive MRSA colonization surveillance program. It is not intended to diagnose MRSA infection nor to guide or monitor treatment for MRSA infections. Performed at [facility], [place] [address] [place], [place] [address]     Anti-infectives:  Anti-infectives (From admission, onward)   None      Best Practice/Protocols:  VTE Prophylaxis: Lovenox (prophylaxtic dose) Intermittent Sedation  Consults: Treatment Team:  Earnie Larsson, MD    Events:  Subjective:    Overnight Issues: None  Husband at bedside  Wants to talk with neurosurgery   Objective:  Vital signs for last 24 hours: Temp:  [98.3 F (36.8 C)-100.3 F (37.9 C)] 98.6 F (37 C) (02/03 0400) Pulse Rate:  [118-135] 124 (02/03 0752) Resp:  [15-18] 16 (02/03 0752) BP: (86-148)/(62-87) 130/82 (02/03 0752) SpO2:  [99 %-100 %] 100 % (02/03 0752) FiO2 (%):  [40 %] 40 % (02/03 0752)  Hemodynamic parameters for last 24 hours:    Intake/Output from previous day: 02/02 0701 - 02/03 0700 In: 2708.5 [I.V.:2067.4; IV Piggyback:466.1] Out: 1000 [Urine:1000]  Intake/Output this shift: No intake/output data recorded.  Vent settings for last 24 hours: Vent Mode: PRVC FiO2 (%):  [40 %] 40 % Set Rate:  [16 bmp] 16 bmp Vt Set:  [510 mL] 510 mL PEEP:  [5 cmH20] 5 cmH20 Plateau Pressure:  [14 cmH20-16 cmH20] 15 cmH20  Physical Exam:  Neuro: RASS -2 Resp: clear to auscultation bilaterally  CVS: regular rate and rhythm, S1, S2 normal, no murmur, click, rub or gallop  Results for orders placed or performed during the hospital encounter of 09/24/2018 (from the past 24 hour(s))  CBC     Status: Abnormal   Collection Time: 10-19-18  1:35 AM  Result Value Ref Range   WBC 10.3 4.0 - 10.5 K/uL   RBC 2.83 (L) 3.87 - 5.11 MIL/uL   Hemoglobin 8.3 (L) 12.0 - 15.0 g/dL   HCT 26.3 (L) 36.0 - 46.0 %   MCV 92.9 80.0 - 100.0 fL   MCH 29.3 26.0 - 34.0 pg   MCHC 31.6 30.0 - 36.0  g/dL   RDW 13.3 11.5 - 15.5 %   Platelets 193 150 - 400 K/uL   nRBC 0.0 0.0 - 0.2 %  Basic metabolic panel     Status: Abnormal   Collection Time: October 19, 2018  1:35 AM  Result Value Ref Range   Sodium 136 135 - 145 mmol/L   Potassium 3.8 3.5 - 5.1 mmol/L   Chloride 107 98 - 111 mmol/L   CO2 19 (L) 22 - 32 mmol/L   Glucose, Bld 117 (H) 70 - 99 mg/dL   BUN 9 6 - 20 mg/dL   Creatinine, Ser 0.85 0.44 - 1.00 mg/dL   Calcium 8.0 (L) 8.9 - 10.3 mg/dL   GFR calc non Af Amer >60 >60 mL/min   GFR calc Af Amer >60 >60 mL/min   Anion gap 10 5 - 15     Assessment/Plan:  Fall TBI/diffuse SAH/interhemispheric SDHwith cerebral edema and some brain stem infarct- per Dr. 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  I have reviewed this patient's available data, including medical history, events of note, physical examination and test results as part of my evaluation.

## 2018-10-25 NOTE — Progress Notes (Signed)
No new issues or problems overnight.  Patient remains unconscious with no eye-opening to noxious stimuli.  Pupils remain fixed and dilated.  Corneal reflexes and oculocephalic reflexes absent.  Still with some occasional coughing to tracheal stimulation.'s still with some intermittent respiratory effort.  Follow-up head CT scan demonstrates evolution of her severe brain injury with malignant cerebral edema and evidence of trans-tentorial herniation.  Now there are also changes of significant Duret  hemorrhage into the brainstem as well.  Head CT scan demonstrates severe profound damage to her supratentorial region and throughout her brainstem.  Although the patient has some persistent lower brainstem function overall I do not think that she has any realistic chance for improvement.  I discussed situation with the family.  They are aware of her grave condition and wish to withdraw care and allow her to pass more naturally.  I will make arrangements for this.

## 2018-10-25 DEATH — deceased

## 2018-12-31 ENCOUNTER — Ambulatory Visit: Admitting: Neurology

## 2019-06-19 IMAGING — CT CT HEAD W/O CM
3 of 7 series · 14 of 47 positions shown, 17 images · non-contrast
Comparison: None.

CLINICAL DATA: Fall backwards down stairs, hit head.  Unresponsive.

EXAM:
CT HEAD WITHOUT CONTRAST
CT CERVICAL SPINE WITHOUT CONTRAST
TECHNIQUE: Multidetector CT imaging of the head and cervical spine was
performed following the standard protocol without intravenous
contrast. Multiplanar CT image reconstructions of the cervical spine
were also generated.

[Series 6: head 3.0 mpr cor · coronal · 0.34mm/px · 3 of 67 slices shown]
[im 17/67  brain]
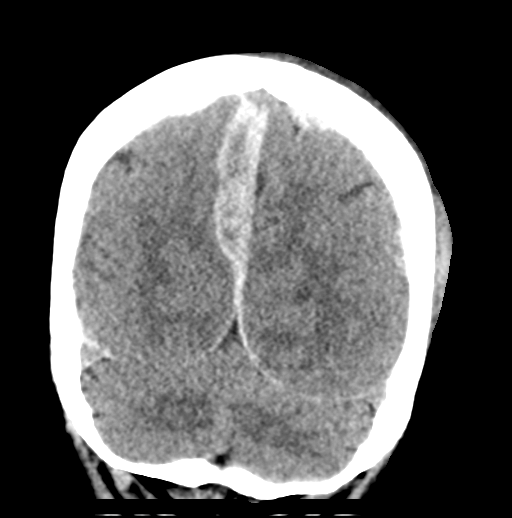
[im 34/67  brain]
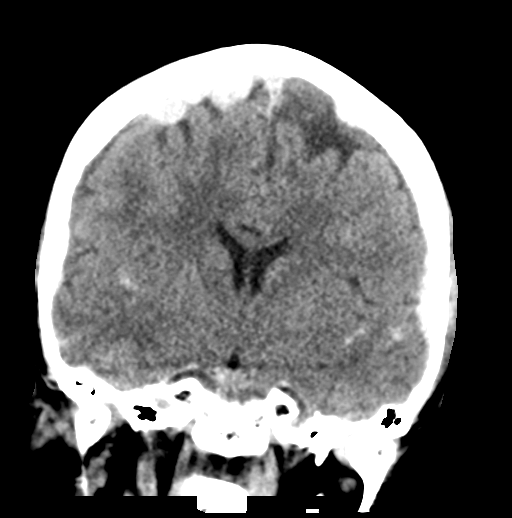
[im 50/67  brain]
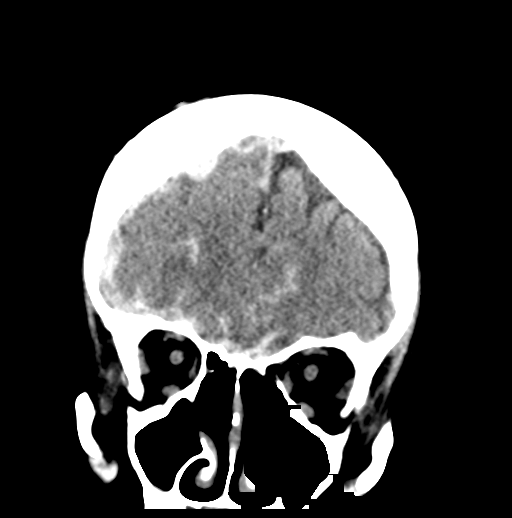

[Series 7: head 3.0 mpr sag · sagittal · 0.34mm/px · 1 of 57 slices shown]
[im 29/57  brain]
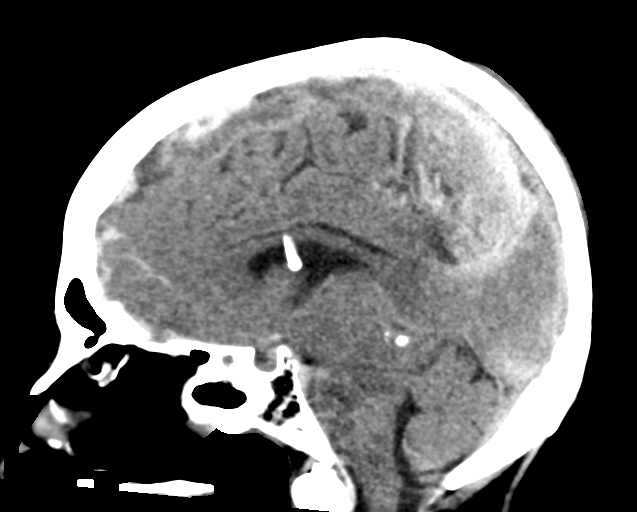

[Series 13: orthogonal axial st · axial · 0.21mm/px · z∈[-337,-193]mm · 10 of 81 slices shown, 13 images]
[im 8/81  brain]
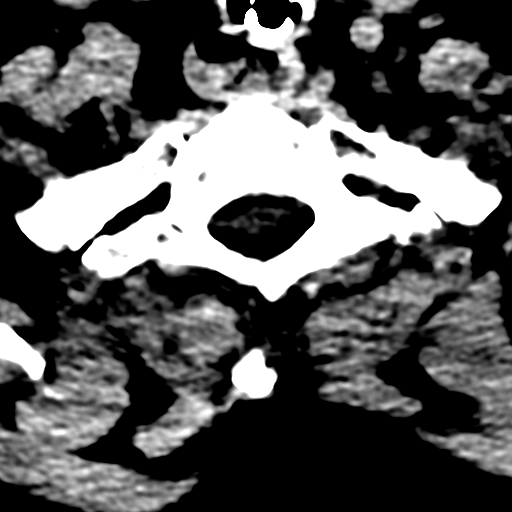
[im 8/81  bone]
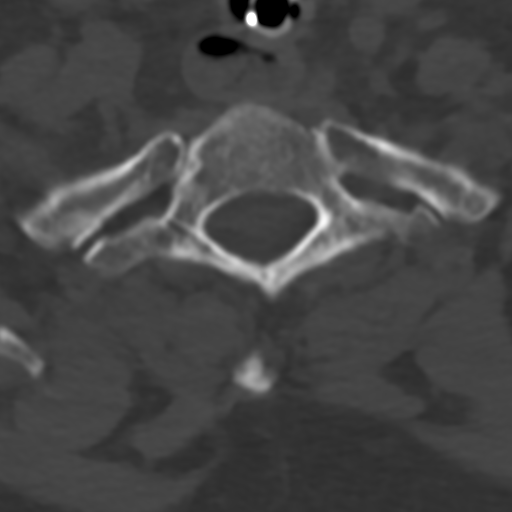
[im 15/81  brain]
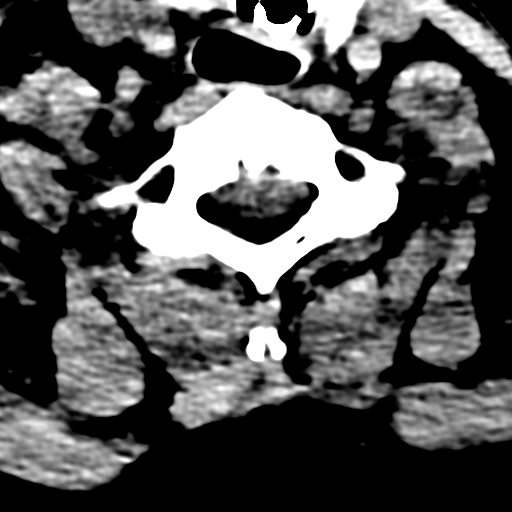
[im 22/81  brain]
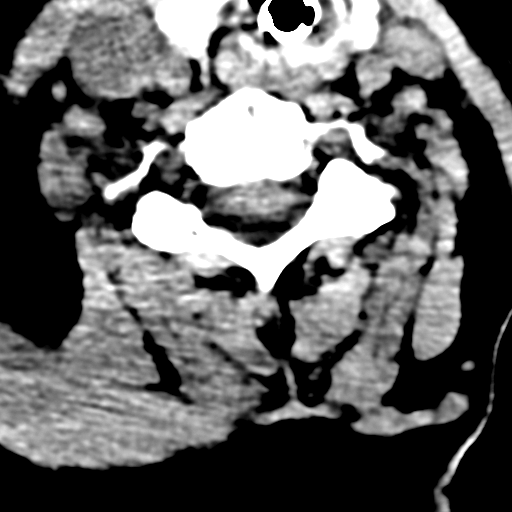
[im 30/81  brain]
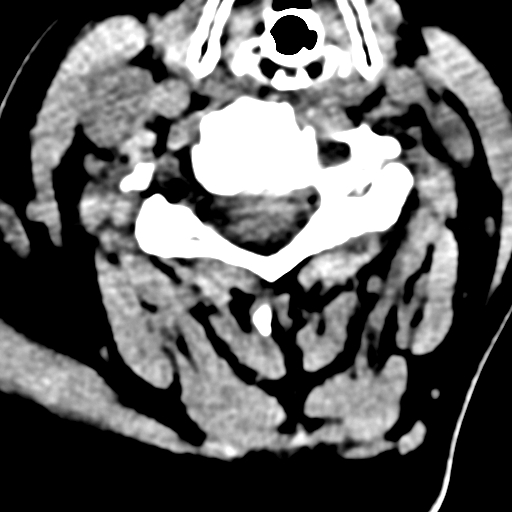
[im 37/81  brain]
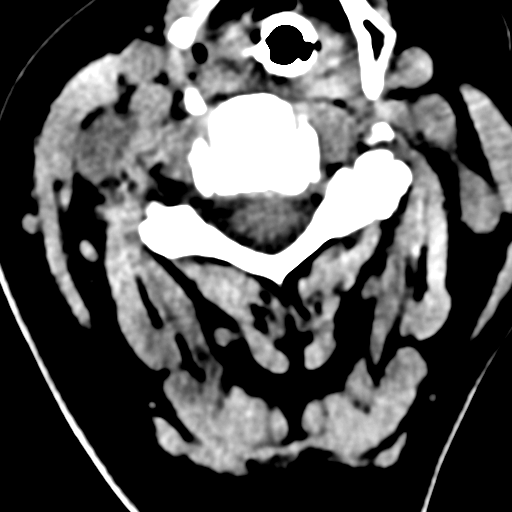
[im 37/81  bone]
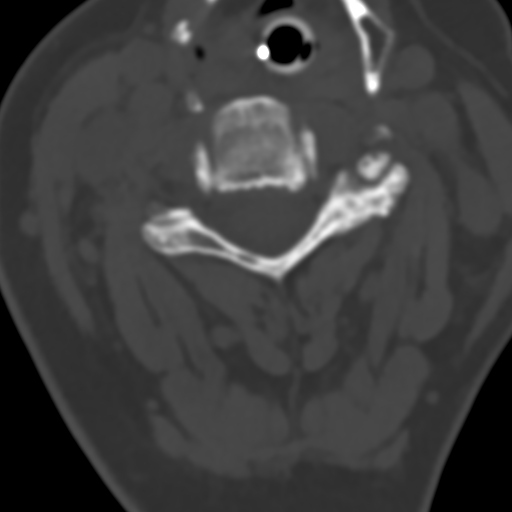
[im 44/81  brain]
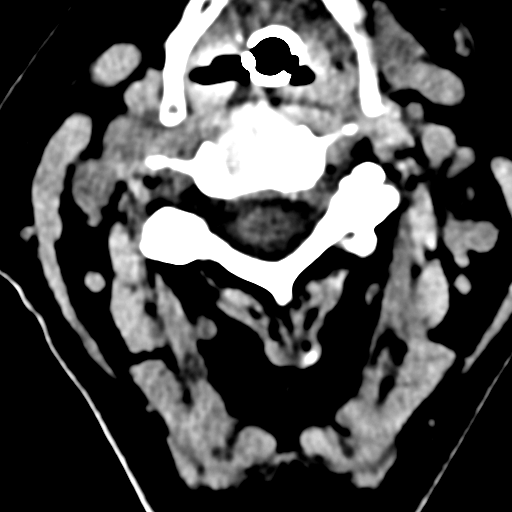
[im 51/81  brain]
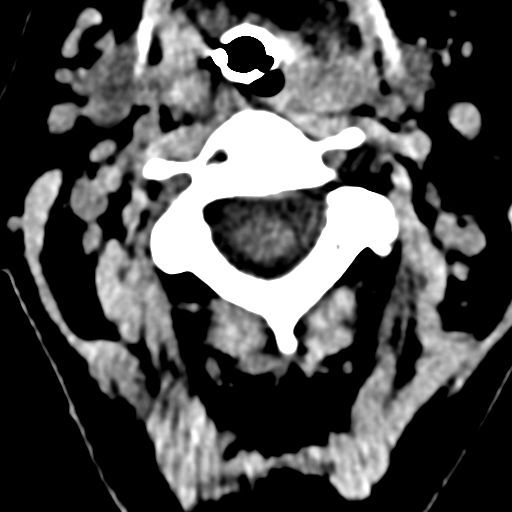
[im 59/81  brain]
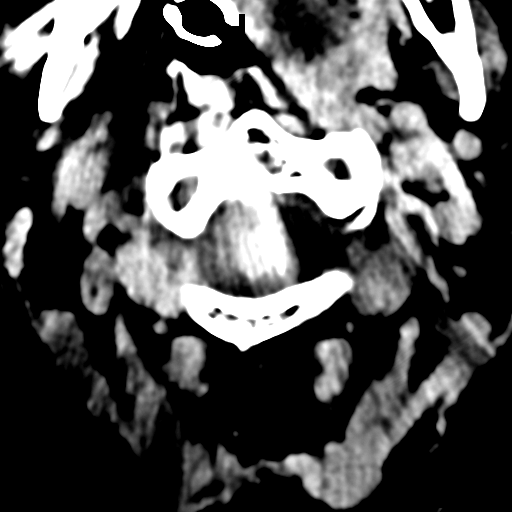
[im 66/81  brain]
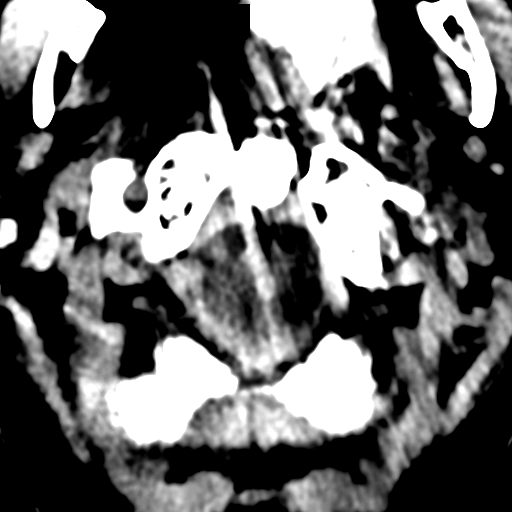
[im 66/81  bone]
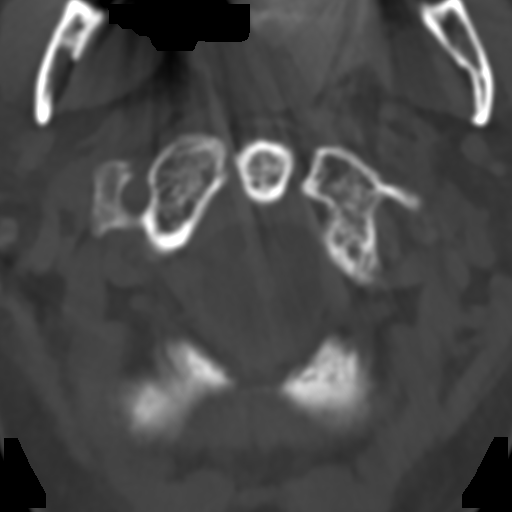
[im 73/81  brain]
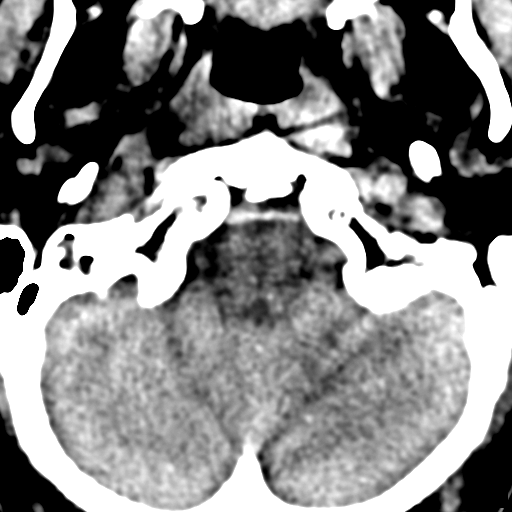

[14 of 47 positions shown; findings below may reference images not displayed]

FINDINGS: CT HEAD FINDINGS

Brain: There is diffuse subarachnoid blood. This is most pronounced
over the frontal lobes bilaterally. Blood is also noted along the
tentorium with largest area posteriorly measuring up to 1.3 cm in
thickness. This could be subarachnoid or subdural in location. No
visible intraparenchymal hemorrhage. Right frontal VP shunt is in
place. Ventricles are decompressed. No visible herniation.

Vascular: Difficult to visualize the vessels due to the diffuse
subarachnoid blood.

Skull: No acute calvarial abnormality.

Sinuses/Orbits: Coastal thickening throughout the paranasal sinuses.
Mastoid air cells clear.

Other: None

CT CERVICAL SPINE FINDINGS

Alignment: No subluxation.

Skull base and vertebrae: No acute fracture. No primary bone lesion
or focal pathologic process.

Soft tissues and spinal canal: No prevertebral fluid or swelling. No
visible canal hematoma.

Disc levels: Degenerative disc disease at C5-6 with disc space
narrowing and spurring. Mild diffuse degenerative facet disease
bilaterally, left greater than right.

Upper chest: No acute findings.

Other: None
IMPRESSION: Diffuse subarachnoid hemorrhage, most notable over both frontal
lobes.

Interhemispheric blood along the falx, thickest posteriorly
measuring up to 1.3 cm in thickness.

No intraparenchymal hemorrhage.  No visible herniation currently.

Degenerative disc and facet disease in the cervical spine. No acute
bony abnormality.

Critical Value/emergent results were discussed in person at the time
of interpretation on 09/25/2018 at [DATE] with Dr. Nhora Elver .

## 2019-06-20 IMAGING — DX DG ABD PORTABLE 1V
1 series · 1 of 1 positions shown · non-contrast
Comparison: None.

CLINICAL DATA: OG tube placement

EXAM:
PORTABLE ABDOMEN - 1 VIEW

[abdomen kub]
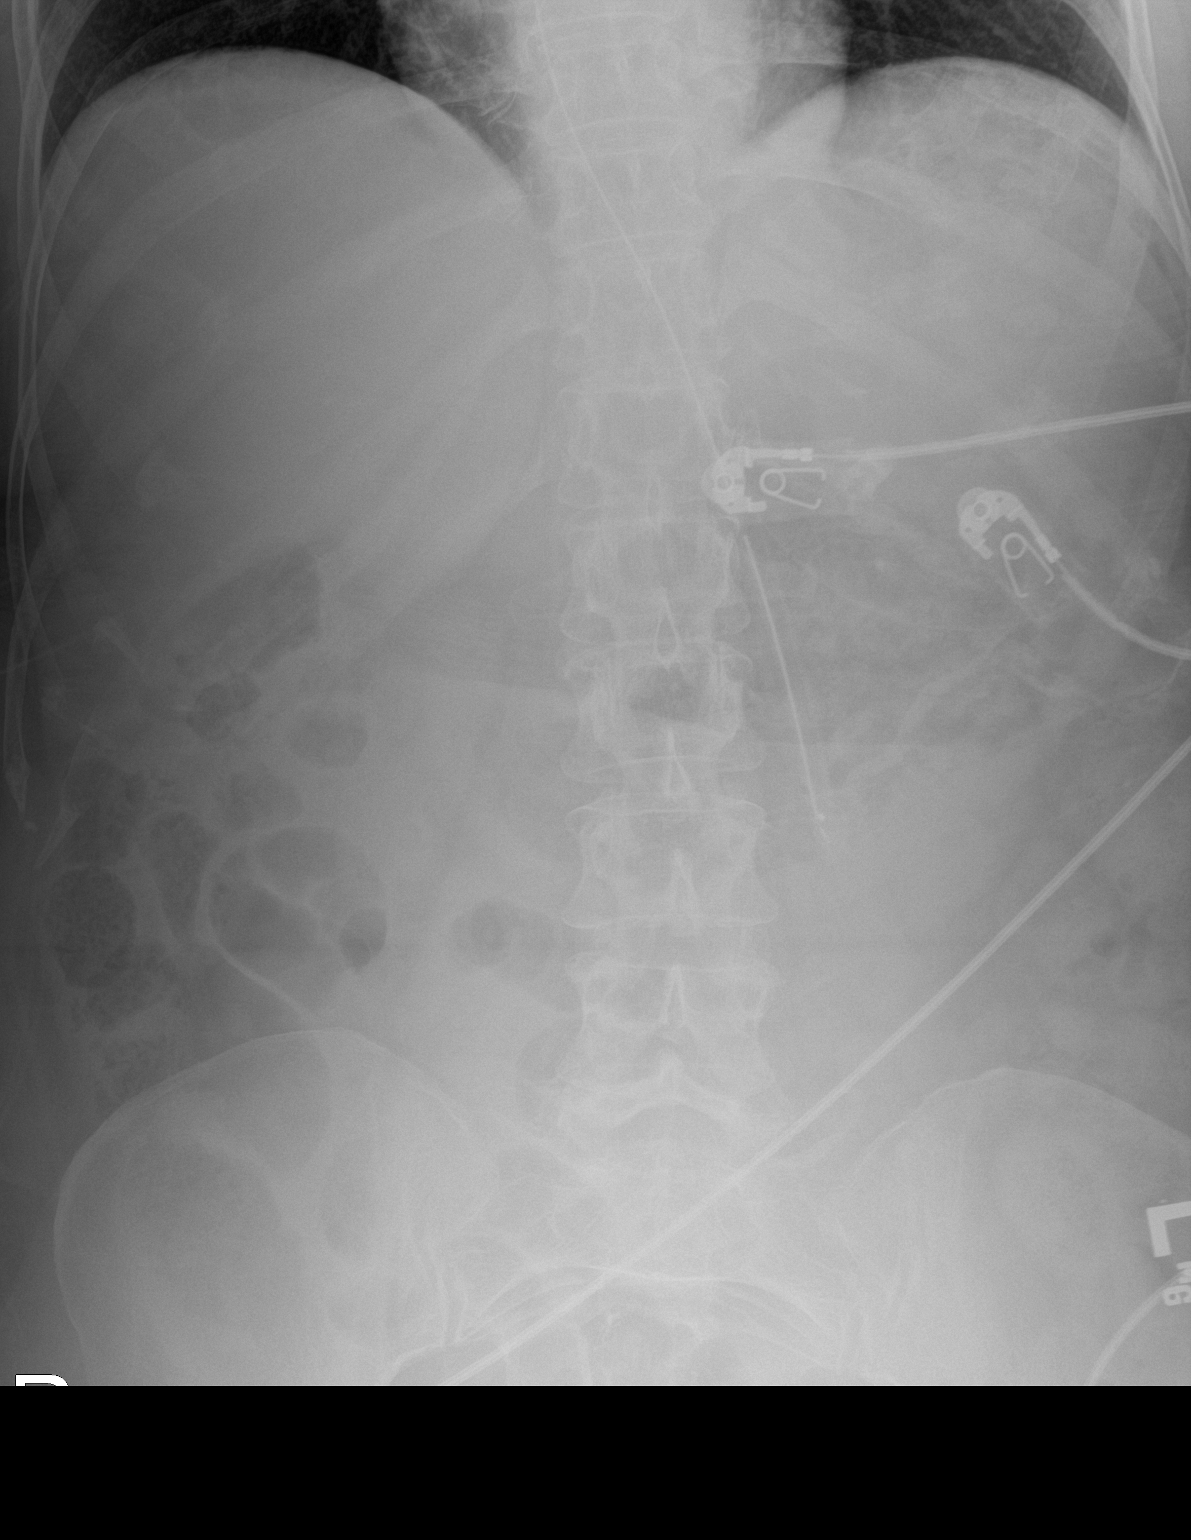

[1 of 1 positions shown; findings below may reference images not displayed]

FINDINGS: Enteric tube tip is in the upper mid abdomen consistent with
location in the body of the stomach. Bowel gas pattern is normal
with scattered gas in the colon. No small or large bowel distention.
Visualized bones appear intact. No radiopaque stones.
IMPRESSION: Enteric tube tip is in the upper mid abdomen consistent with
location in the body of the stomach. Nonobstructive bowel gas
pattern.
